# Patient Record
Sex: Female | Born: 1935 | Race: White | Hispanic: No
Health system: Midwestern US, Academic
[De-identification: ages and names within clinical notes are randomized; demographics above are authoritative.]

---

## 2017-01-05 LAB — COMPREHENSIVE METABOLIC PANEL
Lab: 0.6
Lab: 106
Lab: 113
Lab: 19
Lab: 25
Lab: 9.3
Lab: 95
Lab: 95

## 2017-04-10 ENCOUNTER — Encounter: Admit: 2017-04-10 | Discharge: 2017-04-10 | Payer: MEDICARE

## 2017-04-10 ENCOUNTER — Ambulatory Visit: Admit: 2017-04-10 | Discharge: 2017-04-11 | Payer: MEDICARE

## 2017-04-10 DIAGNOSIS — IMO0002 Squamous cell carcinoma: ICD-10-CM

## 2017-04-10 DIAGNOSIS — I10 Essential (primary) hypertension: ICD-10-CM

## 2017-04-10 DIAGNOSIS — K219 Gastro-esophageal reflux disease without esophagitis: ICD-10-CM

## 2017-04-10 DIAGNOSIS — M8580 Other specified disorders of bone density and structure, unspecified site: Principal | ICD-10-CM

## 2017-04-10 NOTE — Progress Notes
Date of Service: 04/10/2017    Tamara Weiss is a 83 y.o. female.       HPI     I had the opportunity to meet Tamara Weiss for the 1st time in my office today.  She is a long-time patient of Dr. Amelia Jo.  Her care has been transitioned to me with Dr. Amelia Jo retirement.  Tamara Weiss is 83 years of age.  She lives independently on [address] [place].  Her husband is a retired Stage manager.  He died after complications with Alzheimer's in 2009.       Tamara Weiss has had a very difficult summer.  She had a fall in June that led to a C2 fracture in her cervical spine.  This has been managed conservatively by a neurosurgeon through [facility].  She initially wore an Aspen collar for 6-8 weeks.  She is now in a soft collar which will she wear for an additional 6 weeks.  Surgery was not offered.  She is having pain.  She is uncomfortable with her brace.  Otherwise, she seems to be doing well following the significant injury.  We talked about the mechanism of her fall.  She was outside watering plants.  Her feet got 'mixed up'.  She fell forward, hitting her head on the concrete.  The head snapped back, resulting in the fracture.  She did not seek immediate attention.  Two days later, she was having increasing neck pain which took her to [facility].  She was thereafter transferred to [facility] where she remained in the hospital for 3 days.     Tamara Weiss additionally is concerned that she had a 'kissing bug bite'.  She has done quite a bit of research on this.  She actually sent the bug to [place] to have it evaluated.  She indicates they confirmed it was indeed a kissing bug.  This is a bug that transmits Chagas disease.  She is concerned that she potentially has this.     Tamara Weiss indicates increasing dyspnea with exertion.  This correlates with her injury, and I think is probably due to deconditioning.  I have asked her to get back to regular walking exercise at the gym.  She seems encouraged to do so.  Tamara Weiss is still driving on a regular basis.  She looks forward to increasing her activity as she recovers from this injury 3 months ago.    (FAO:[id number])             Vitals:    04/10/17 0934   BP: 149/86   Pulse: 67   Weight: 70.8 kg (156 lb)   Height: 1.626 m (5' 4)     Body mass index is 26.78 kg/m???.     Past Medical History  Patient Active Problem List    Diagnosis Date Noted   ??? Hypothyroidism 05/18/2010   ??? 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 None as of 2013    Osteoporosis Risk Factors and Contributing Diagnosis  ??? Menarche 14  Menopause:  Age 13 natural.  ERT:  12 years 1989  ??? Parental hip fracture history:  None.  Mother did have osteoporosis  ??? Glucocorticoid therapy history:  She took prednisone for polymyalgia rheumatica 2005 for approximately 1year  ??? Renal stone:  None  ??? Smoking history:  None  ??? Alcohol:  0/week     Osteoporosis Treatment History  ???  Miacalcin 2001-2003  ??? Evista 1999-2002  ??? Fosamax 2003-2009.  This was stopped due to length of therapy and stability of bone density.       ??? Vitamin D deficiency 05/18/2010   ??? PVC's (premature ventricular contractions) 02/27/2010     2600 isolated PVCs by 2006 holter     ??? Chest pain 02/27/2010     2007-Neg thallium  Resolved after DC Fosamax  2014-Thallium MPI:  EF 66%. Study is normal but with poor exercise capacity.  No definite perfusion defects are present.    ???05/09/16-Echo:  EF 55%.  LA is moderately enlarged.  AV is heavily sclerotic with mild AS.  Mitral annulus is moderately calcified.  Moderate TR.  Estimated PA systolic pressure is 45-50 mm Hg.  Mild MR is present     ??? Umbilical hernia 02/27/2010     2010: Laparoscopic umbilical hernia repair-bled from Hgb 14 to 8, took 3 mo to recover     ??? Skin cancer 02/27/2010     Sq cell removed from nose 2011     ??? Elevated blood pressure 02/27/2010 2011: some readings ~150s/80s, but most are WNL. (All outside Dr office are WNL)  05/09/16-Echo:  EF 55%.  LA is moderately enlarged.  AV is heavily sclerotic with mild AS.  Mitral annulus is moderately calcified.  Moderate TR.  Estimated PA systolic pressure is 45-50 mm Hg.  Mild MR is present           Review of Systems   Constitution: Positive for malaise/fatigue.   Cardiovascular: Positive for dyspnea on exertion.   Respiratory: Positive for shortness of breath.    All other systems reviewed and are negative.      Physical Exam  General Appearance: resting comfortably, no acute distress  Skin: warm, moist, no ulcers or xanthomas  Digits and Nails: no clubbing  Eyes: conjunctivae and lids normal, pupils are equal and round  Lips & Oral Mucosa: no pallor or cyanosis  Ear, Nose, Throat: No deformities, posterior oral pharynx clear  Neck Veins: neck veins are flat, neck veins are not distended  Chest Inspection: chest is normal in appearance  Respiratory Effort: breathing is unlabored, no respiratory distress  Auscultation/Percussion: lungs clear to auscultation, no rales, rhonchi, or wheezing  PMI: PMI not enlarged or displaced  Cardiac Rhythm: regular rhythm and normal rate  Cardiac Auscultation: Normal S1 & S2, no S3 or S4, no rub  Murmurs: no cardiac murmurs   Carotid Arteries: normal carotid upstroke bilaterally, no bruits  Pedal Pulses: normal symmetric pedal pulses  Lower Extremity Edema: no lower extremity edema  Abdominal Exam: soft, non-tender, no masses, bowel sounds normal  Abdominal Aorta: nonpalpable abdominal aorta; no abdominal bruits  Liver & Spleen: no organomegaly  Gait & Station: normal balance and gait  Muscle Strength: normal strength and tone  Neurologic Exam: neurological assessment grossly intact  Orientation: oriented to time, place and person  Affect & Mood: appropriate and sustained affect  Other: moves all extremities    Cardiovascular Studies  ECG - NSR with frequent PVCs Problems Addressed Today  Encounter Diagnoses   Name Primary?   ??? PVC's (premature ventricular contractions)    ??? Hypertension goal BP (blood pressure) < 130/80        Assessment and Plan     1. Ventricular ectopy.  Mrs. Padmanabhan has had frequent PVCs for several years.  She has been evaluated in the past by Dr. Clint Bolder and more recently by Dr. Glade Nurse.  Her testing over the years has included a stress test in 2014.  Her study at that time was normal, no ischemia, and preserved LV function.  More recently, she had an echocardiogram last summer.  It too was normal with preserved LV function.  She describes her ventricular ectopy as 'mildly bothersome'.  She has been living with this for years and does not seek further treatment.    2. Aortic valve stenosis.  Mrs. Mohamud had mild AS, based on her echocardiogram last summer.  I have asked her to have a repeat study, given her complaints of worsening dyspnea.  She will do this in the next few weeks.  3. Hypertension.  The patient's blood pressure is elevated today.  Historically, we have seen normal blood pressure readings consistently.  She indicates her home readings are normal.  I will hold off on any further medication adjustments at this time.   She is currently not on antihypertensive therapy.  I will see her back in the office in 6 months with a decision whether we need to start her on BP medications.  4. C2 fracture.  Mrs. Locust suffered a fall with resultant injury and a C2 fracture in June.  She is following with [facility].  She has been in an immobilizing brace and an Aspen collar for 6 weeks.  She is now in a soft collar which she will wear for an additional 6 weeks.       I look forward to seeing Mrs. Cookston back in the office in 6 months.    (ATF:[id number])             Current Medications (including today's revisions)  ??? levothyroxine (SYNTHROID) 75 mcg PO tablet Take 75 mcg by mouth daily.   ??? magnesium ??? Omega-3 Acid Ethyl Esters 1 gram cap Take 1 g by mouth twice daily with meals.

## 2017-04-11 DIAGNOSIS — R011 Cardiac murmur, unspecified: ICD-10-CM

## 2017-04-11 DIAGNOSIS — R030 Elevated blood-pressure reading, without diagnosis of hypertension: ICD-10-CM

## 2017-04-11 DIAGNOSIS — I10 Essential (primary) hypertension: ICD-10-CM

## 2017-04-11 DIAGNOSIS — R0609 Other forms of dyspnea: ICD-10-CM

## 2017-04-11 DIAGNOSIS — Z9181 History of falling: ICD-10-CM

## 2017-04-11 DIAGNOSIS — I493 Ventricular premature depolarization: Principal | ICD-10-CM

## 2017-04-30 ENCOUNTER — Ambulatory Visit: Admit: 2017-04-30 | Discharge: 2017-04-30 | Payer: MEDICARE

## 2017-04-30 DIAGNOSIS — I493 Ventricular premature depolarization: Principal | ICD-10-CM

## 2017-04-30 DIAGNOSIS — I10 Essential (primary) hypertension: ICD-10-CM

## 2017-04-30 DIAGNOSIS — R011 Cardiac murmur, unspecified: ICD-10-CM

## 2017-05-10 ENCOUNTER — Encounter: Admit: 2017-05-10 | Discharge: 2017-05-10 | Payer: MEDICARE

## 2017-05-10 DIAGNOSIS — I350 Nonrheumatic aortic (valve) stenosis: Principal | ICD-10-CM

## 2017-07-05 LAB — COMPREHENSIVE METABOLIC PANEL
Lab: 0.6
Lab: 0.8
Lab: 103
Lab: 108
Lab: 12
Lab: 141
Lab: 20
Lab: 21
Lab: 27
Lab: 4.5
Lab: 6.6
Lab: 64
Lab: 73
Lab: 9.8
Lab: 92

## 2017-07-05 LAB — THYROID STIMULATING HORMONE-TSH: Lab: 3.2

## 2017-11-07 ENCOUNTER — Encounter: Admit: 2017-11-07 | Discharge: 2017-11-07 | Payer: MEDICARE

## 2017-11-08 ENCOUNTER — Encounter: Admit: 2017-11-08 | Discharge: 2017-11-08 | Payer: MEDICARE

## 2017-11-08 ENCOUNTER — Ambulatory Visit: Admit: 2017-11-08 | Discharge: 2017-11-09 | Payer: MEDICARE

## 2017-11-08 DIAGNOSIS — M8580 Other specified disorders of bone density and structure, unspecified site: Principal | ICD-10-CM

## 2017-11-08 DIAGNOSIS — IMO0002 Squamous cell carcinoma: ICD-10-CM

## 2017-11-08 DIAGNOSIS — I10 Essential (primary) hypertension: ICD-10-CM

## 2017-11-08 DIAGNOSIS — I493 Ventricular premature depolarization: Secondary | ICD-10-CM

## 2017-11-08 DIAGNOSIS — K219 Gastro-esophageal reflux disease without esophagitis: ICD-10-CM

## 2017-11-09 DIAGNOSIS — I350 Nonrheumatic aortic (valve) stenosis: Principal | ICD-10-CM

## 2018-03-12 ENCOUNTER — Encounter: Admit: 2018-03-12 | Discharge: 2018-03-12 | Payer: MEDICARE

## 2018-03-13 ENCOUNTER — Encounter: Admit: 2018-03-13 | Discharge: 2018-03-13 | Payer: MEDICARE

## 2018-03-18 ENCOUNTER — Ambulatory Visit: Admit: 2018-03-18 | Discharge: 2018-03-19 | Payer: MEDICARE

## 2018-03-18 ENCOUNTER — Encounter: Admit: 2018-03-18 | Discharge: 2018-03-18 | Payer: MEDICARE

## 2018-03-18 DIAGNOSIS — I10 Essential (primary) hypertension: ICD-10-CM

## 2018-03-18 DIAGNOSIS — M8580 Other specified disorders of bone density and structure, unspecified site: Principal | ICD-10-CM

## 2018-03-18 DIAGNOSIS — R002 Palpitations: ICD-10-CM

## 2018-03-18 DIAGNOSIS — R072 Precordial pain: ICD-10-CM

## 2018-03-18 DIAGNOSIS — I493 Ventricular premature depolarization: ICD-10-CM

## 2018-03-18 DIAGNOSIS — IMO0002 Squamous cell carcinoma: ICD-10-CM

## 2018-03-18 DIAGNOSIS — R079 Chest pain, unspecified: Principal | ICD-10-CM

## 2018-03-18 DIAGNOSIS — K219 Gastro-esophageal reflux disease without esophagitis: ICD-10-CM

## 2018-03-18 DIAGNOSIS — I350 Nonrheumatic aortic (valve) stenosis: ICD-10-CM

## 2018-03-18 LAB — MAGNESIUM: Lab: 2.2

## 2018-03-18 LAB — POTASSIUM: Lab: 4.6

## 2018-03-18 LAB — THYROID STIMULATING HORMONE-TSH: Lab: 2.4

## 2018-03-20 ENCOUNTER — Encounter: Admit: 2018-03-20 | Discharge: 2018-03-20 | Payer: MEDICARE

## 2018-03-20 DIAGNOSIS — R002 Palpitations: ICD-10-CM

## 2018-03-20 DIAGNOSIS — I350 Nonrheumatic aortic (valve) stenosis: ICD-10-CM

## 2018-03-20 DIAGNOSIS — I493 Ventricular premature depolarization: ICD-10-CM

## 2018-03-20 DIAGNOSIS — R079 Chest pain, unspecified: Principal | ICD-10-CM

## 2018-03-20 DIAGNOSIS — R072 Precordial pain: ICD-10-CM

## 2018-03-21 ENCOUNTER — Encounter: Admit: 2018-03-21 | Discharge: 2018-03-21 | Payer: MEDICARE

## 2018-04-01 ENCOUNTER — Encounter: Admit: 2018-04-01 | Discharge: 2018-04-01 | Payer: MEDICARE

## 2018-04-01 ENCOUNTER — Ambulatory Visit: Admit: 2018-04-01 | Discharge: 2018-04-01 | Payer: MEDICARE

## 2018-04-01 ENCOUNTER — Ambulatory Visit: Admit: 2018-04-01 | Discharge: 2018-04-02 | Payer: MEDICARE

## 2018-04-01 DIAGNOSIS — R0789 Other chest pain: ICD-10-CM

## 2018-04-02 ENCOUNTER — Encounter: Admit: 2018-04-02 | Discharge: 2018-04-02 | Payer: MEDICARE

## 2018-04-22 ENCOUNTER — Encounter: Admit: 2018-04-22 | Discharge: 2018-04-22 | Payer: MEDICARE

## 2018-05-01 ENCOUNTER — Ambulatory Visit: Admit: 2018-05-01 | Discharge: 2018-05-02 | Payer: MEDICARE

## 2018-05-01 ENCOUNTER — Encounter: Admit: 2018-05-01 | Discharge: 2018-05-01 | Payer: MEDICARE

## 2018-05-01 DIAGNOSIS — IMO0002 Squamous cell carcinoma: ICD-10-CM

## 2018-05-01 DIAGNOSIS — I350 Nonrheumatic aortic (valve) stenosis: ICD-10-CM

## 2018-05-01 DIAGNOSIS — K219 Gastro-esophageal reflux disease without esophagitis: ICD-10-CM

## 2018-05-01 DIAGNOSIS — G4733 Obstructive sleep apnea (adult) (pediatric): ICD-10-CM

## 2018-05-01 DIAGNOSIS — M8580 Other specified disorders of bone density and structure, unspecified site: Principal | ICD-10-CM

## 2018-05-01 DIAGNOSIS — I272 Pulmonary hypertension, unspecified: ICD-10-CM

## 2018-05-01 DIAGNOSIS — I10 Essential (primary) hypertension: ICD-10-CM

## 2018-05-01 DIAGNOSIS — R0789 Other chest pain: Principal | ICD-10-CM

## 2019-01-09 ENCOUNTER — Encounter: Admit: 2019-01-09 | Discharge: 2019-01-09

## 2019-01-09 NOTE — Progress Notes
01/09/2019 4:48 PM Labs entered from PCP and routed to [facility] for review.

## 2019-01-12 ENCOUNTER — Encounter: Admit: 2019-01-12 | Discharge: 2019-01-12

## 2019-01-12 NOTE — Progress Notes
Pt has OV w/ MAA on 6/19, can discuss then

## 2019-01-12 NOTE — Progress Notes
Tamara Oyster, MD  P Cvm Nurse [facility]/[place]            Testing results reviewed   Some improvement in lipids   From last OV notes:   She declines medications other than fish oil supplements. She states her father had high cholesterol and lived to his 54s. She does not want to be on a statin

## 2019-01-16 ENCOUNTER — Encounter: Admit: 2019-01-16 | Discharge: 2019-01-16

## 2019-01-16 DIAGNOSIS — IMO0002 Squamous cell carcinoma: Secondary | ICD-10-CM

## 2019-01-16 DIAGNOSIS — I071 Rheumatic tricuspid insufficiency: Secondary | ICD-10-CM

## 2019-01-16 DIAGNOSIS — I10 Essential (primary) hypertension: Secondary | ICD-10-CM

## 2019-01-16 DIAGNOSIS — M8580 Other specified disorders of bone density and structure, unspecified site: Secondary | ICD-10-CM

## 2019-01-16 DIAGNOSIS — K219 Gastro-esophageal reflux disease without esophagitis: Secondary | ICD-10-CM

## 2019-01-16 DIAGNOSIS — I350 Nonrheumatic aortic (valve) stenosis: Secondary | ICD-10-CM

## 2019-01-16 NOTE — Progress Notes
Date of Service: 01/16/2019    Tamara Weiss is a 85 y.o. female.       HPI     85 year old female with a known history of aortic stenosis sleep apnea hypothyroidism hypertension and hyperlipidemia returns for follow-up visit.    She was last seen by me in October 2019.    She has elevated LDL but declined statins.    Recent testing includes a dobutamine stress MPI in September 2019 which was negative for ischemia.     Echocardiogram also in September showed an EF of 60% with mild aortic stenosis and moderate to severe tricuspid regurgitation.  Estimated peak systolic PA pressure was 51 mmHg.    She states she feels well with no chest pain or shortness of breath no PND orthopnea or swelling of ankles.  She remains active.           Vitals:    01/16/19 1302   BP: 112/72   BP Source: Arm, Left Upper   Pulse: 40   Weight: 69.8 kg (153 lb 12.8 oz)   Height: 1.626 m (5' 4)   PainSc: Zero     Body mass index is 26.4 kg/m???.     Past Medical History  Patient Active Problem List    Diagnosis Date Noted   ??? Moderate tricuspid regurgitation 01/16/2019   ??? Mixed hyperlipidemia 01/16/2019   ??? Pulmonary hypertension (HCC) 05/01/2018   ??? OSA (obstructive sleep apnea) 05/01/2018   ??? Precordial chest pain 03/18/2018   ??? Nonrheumatic aortic valve stenosis 11/07/2017     05/09/16-Echo:  EF  55%.  LA is moderately enlarged.  AV is heavily sclerotic with mild AS.  Mitral annulus is moderately calcified.  Moderate TR.  Estimated PA systolic pressure is 45-50 mm Hg.  Mild MR is present.  04/30/17-Echo:  EF 60%. 60%. Mild diastolic dysfunction.  Calcified restricted aortic valve, moderate stenosis.  Mean gradient just 14 mmHg, calculated AVA 1.2 cm2. Estimated PA systolic pressure is 34 mmHg.        ??? Hypothyroidism 05/18/2010   ??? 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 None as of 2013    Osteoporosis Risk Factors and Contributing Diagnosis ??? Menarche 14  Menopause:  Age 54 natural.  ERT:  12 years 1989  ??? Parental hip fracture history:  None.  Mother did have osteoporosis  ??? Glucocorticoid therapy history:  She took prednisone for polymyalgia rheumatica 2005 for approximately 1year  ??? Renal stone:  None  ??? Smoking history:  None  ??? Alcohol:  0/week     Osteoporosis Treatment History  ??? Miacalcin 2001-2003  ??? Evista 1999-2002  ??? Fosamax 2003-2009.  This was stopped due to length of therapy and stability of bone density.       ??? Vitamin D deficiency 05/18/2010   ??? PVC's (premature ventricular contractions) 02/27/2010     2600 isolated PVCs by 2006 holter     ??? Chest pain 02/27/2010     2007-Neg thallium  Resolved after DC Fosamax  2014-Thallium MPI:  EF 66%. Study is normal but with poor exercise capacity.  No definite perfusion defects are present.    ???05/09/16-Echo:  EF 55%.  LA is moderately enlarged.  AV is heavily sclerotic with mild AS.  Mitral annulus is moderately calcified.  Moderate TR.  Estimated PA systolic pressure is 45-50 mm Hg.  Mild MR is present     ??? Umbilical  hernia 02/27/2010     2010: Laparoscopic umbilical hernia repair-bled from Hgb 14 to 8, took 3 mo to recover     ??? Skin cancer 02/27/2010     Sq cell removed from nose 2011     ??? Elevated blood pressure 02/27/2010     2011: some readings ~150s/80s, but most are WNL. (All outside Dr office are WNL)  05/09/16-Echo:  EF 55%.  LA is moderately enlarged.  AV is heavily sclerotic with mild AS.  Mitral annulus is moderately calcified.  Moderate TR.  Estimated PA systolic pressure is 45-50 mm Hg.  Mild MR is present           Review of Systems   Constitution: Negative.   HENT: Negative.    Eyes: Negative.    Cardiovascular: Negative.    Respiratory: Negative.    Endocrine: Negative.    Hematologic/Lymphatic: Negative.    Skin: Negative.    Musculoskeletal: Positive for neck pain.   Gastrointestinal: Negative.    Genitourinary: Negative.    Neurological: Negative. Psychiatric/Behavioral: Negative.    Allergic/Immunologic: Negative.        Physical Exam  General Appearance: no acute distress  HEENT: EOMI, MM-moist, post OP-clear  Neck Veins: neck veins are flat & not distended  Carotid Arteries: no bruits  Chest Inspection: chest is normal in appearance  Auscultation/Percussion: lungs clear to auscultation, no rales, rhonchi, or wheezing  Cardiac Rhythm: regular rhythm & normal rate  Cardiac Auscultation: Normal S1 & S2, no S3 or S4, no rub  Murmurs: Ejection systolic murmur second right intercostal space  Extremities: no lower extremity edema; 2+ symmetric distal pulses  Skin: warm & intact  Neurologic Exam: oriented to time, place and person; no focal neurologic deficits      Cardiovascular Studies  As above    Assessment and Plan  Encounter Diagnoses   Name Primary?   ??? Nonrheumatic aortic valve stenosis Yes   ??? Moderate tricuspid regurgitation    ??? OSA (obstructive sleep apnea)    ??? Mixed hyperlipidemia             Ms Yow has mild aortic valve stenosis, moderate to severe TR and hypertension as well as hyperlipidemia. ???She is reluctant to have any medications.    She is asymptomatic from a cardiac standpoint.  We reviewed her prior echocardiogram.  I recommend we obtain a follow-up echo in 6 months when I will see her again.    We discussed her lipids and she again is adamant not to be on any lipid-lowering therapy other than her fish oil supplements.  She states she is sometimes forgetful in taking her supplements.  I encourage medication compliance.    I appreciate the opportunity of seeing Ms. Pawling in clinic today.  She will return for follow-up in 6 months with repeat echo at that time.           Current Medications (including today's revisions)  ??? levothyroxine (SYNTHROID) 75 mcg PO tablet Take 75 mcg by mouth daily.   ??? magnesium    ??? Omega-3 Acid Ethyl Esters 1 gram cap Take 1 g by mouth twice daily with meals.

## 2019-01-17 ENCOUNTER — Ambulatory Visit: Admit: 2019-01-16 | Discharge: 2019-01-17

## 2019-01-17 DIAGNOSIS — E7849 Other hyperlipidemia: Secondary | ICD-10-CM

## 2019-01-17 DIAGNOSIS — E782 Mixed hyperlipidemia: Secondary | ICD-10-CM

## 2019-01-17 DIAGNOSIS — G4733 Obstructive sleep apnea (adult) (pediatric): Secondary | ICD-10-CM

## 2019-02-06 ENCOUNTER — Ambulatory Visit: Admit: 2019-02-06 | Discharge: 2019-02-06

## 2019-02-06 ENCOUNTER — Encounter: Admit: 2019-02-06 | Discharge: 2019-02-06

## 2019-02-06 DIAGNOSIS — I071 Rheumatic tricuspid insufficiency: Secondary | ICD-10-CM

## 2019-02-06 DIAGNOSIS — I350 Nonrheumatic aortic (valve) stenosis: Secondary | ICD-10-CM

## 2019-02-13 ENCOUNTER — Encounter: Admit: 2019-02-13 | Discharge: 2019-02-13

## 2019-02-14 ENCOUNTER — Encounter: Admit: 2019-02-14 | Discharge: 2019-02-14

## 2019-02-16 ENCOUNTER — Encounter: Admit: 2019-02-16 | Discharge: 2019-02-16

## 2019-04-13 ENCOUNTER — Encounter: Admit: 2019-04-13 | Discharge: 2019-04-13 | Payer: MEDICARE

## 2019-04-13 ENCOUNTER — Ambulatory Visit: Admit: 2019-04-13 | Discharge: 2019-04-14 | Payer: MEDICARE

## 2019-04-14 ENCOUNTER — Encounter: Admit: 2019-04-14 | Discharge: 2019-04-14 | Payer: MEDICARE

## 2019-04-14 NOTE — Telephone Encounter
Faxed MRI order to [facility] per patient request.

## 2019-04-18 ENCOUNTER — Encounter: Admit: 2019-04-18 | Discharge: 2019-04-18 | Payer: MEDICARE

## 2019-04-20 ENCOUNTER — Encounter: Admit: 2019-04-20 | Discharge: 2019-04-20 | Payer: MEDICARE

## 2019-04-24 ENCOUNTER — Encounter: Admit: 2019-04-24 | Discharge: 2019-04-24 | Payer: MEDICARE

## 2019-04-24 NOTE — Telephone Encounter
Patient called confirming her upcoming appointment with Dr. [place] as well as wanting to change/update information on her mychart. Gave patient mychart IT line.

## 2019-05-25 ENCOUNTER — Encounter: Admit: 2019-05-25 | Discharge: 2019-05-25 | Payer: MEDICARE

## 2019-05-25 NOTE — Telephone Encounter
Pt called to say she is seeing someone else at [facility] for her neck as it is causing more problems. She just wanted to let us know. I told her to go to that appt and then we will go from there. She verbalized understanding.

## 2019-05-29 ENCOUNTER — Encounter: Admit: 2019-05-29 | Discharge: 2019-05-29 | Payer: MEDICARE

## 2019-05-29 NOTE — Telephone Encounter
Pt states her fractured neck is giving her problems and she's having numbness in her leg  She wants MAA to know that this is going on w/ her.   She had a CT w/ contrast at [place] 2 days ago.   She's been tx at [facility] for her broken neck and recently transitioned to [facility], she's wanting to get in w/ Cardiology, thinking she may need neck surgery, but hoping not.     Very long conversation, she has a lot going on and is concerned about a lot of things.   I let her know MAA is no longer going to [place] or [place], she was very disappointed about that.   She saw LDB years ago and he also cared for her (now deceased) husband so she would like to re establish care w/ him, reviewed the locations he goes to and she would like OP, scheduled her there and she will work on transportation for that appt. She drives locally, but not into [place] area.     She was very pleased to get appt w/ LDB.

## 2019-05-29 NOTE — Telephone Encounter
Patient calling re: upcoming appointment with Dr Bethanne Ginger recent imaging CT scan and MRI C spine at [facility] to request images).  Patient also requesting [place] visit.

## 2019-06-01 ENCOUNTER — Encounter: Admit: 2019-06-01 | Discharge: 2019-06-01 | Payer: MEDICARE

## 2019-06-02 ENCOUNTER — Encounter: Admit: 2019-06-02 | Discharge: 2019-06-02 | Payer: MEDICARE

## 2019-06-02 DIAGNOSIS — I350 Nonrheumatic aortic (valve) stenosis: Secondary | ICD-10-CM

## 2019-06-02 DIAGNOSIS — K219 Gastro-esophageal reflux disease without esophagitis: Secondary | ICD-10-CM

## 2019-06-02 DIAGNOSIS — I10 Essential (primary) hypertension: Secondary | ICD-10-CM

## 2019-06-02 DIAGNOSIS — M8580 Other specified disorders of bone density and structure, unspecified site: Secondary | ICD-10-CM

## 2019-06-02 DIAGNOSIS — IMO0002 Squamous cell carcinoma: Secondary | ICD-10-CM

## 2019-06-02 NOTE — Telephone Encounter
Pre Visit Planning- New Patient    Records received: No    Orders have been NA    Patient active in MyChart. No appointment reminder sent. .     Patient notified of upcoming appointment.    Updated chart: Medication, History and Allergies    CT scan and MRI requested via cloud and patient to bring day of appointment

## 2019-06-06 ENCOUNTER — Encounter: Admit: 2019-06-06 | Discharge: 2019-06-06 | Payer: MEDICARE

## 2019-06-07 ENCOUNTER — Encounter: Admit: 2019-06-07 | Discharge: 2019-06-07 | Payer: MEDICARE

## 2019-06-07 DIAGNOSIS — M542 Cervicalgia: Secondary | ICD-10-CM

## 2019-06-08 ENCOUNTER — Ambulatory Visit: Admit: 2019-06-08 | Discharge: 2019-06-09 | Payer: MEDICARE

## 2019-06-08 ENCOUNTER — Encounter: Admit: 2019-06-08 | Discharge: 2019-06-08 | Payer: MEDICARE

## 2019-06-08 DIAGNOSIS — Z9181 History of falling: Secondary | ICD-10-CM

## 2019-06-08 DIAGNOSIS — I071 Rheumatic tricuspid insufficiency: Secondary | ICD-10-CM

## 2019-06-08 DIAGNOSIS — S12100K Unspecified displaced fracture of second cervical vertebra, subsequent encounter for fracture with nonunion: Secondary | ICD-10-CM

## 2019-06-08 DIAGNOSIS — IMO0002 Squamous cell carcinoma: Secondary | ICD-10-CM

## 2019-06-08 DIAGNOSIS — M8580 Other specified disorders of bone density and structure, unspecified site: Secondary | ICD-10-CM

## 2019-06-08 DIAGNOSIS — K219 Gastro-esophageal reflux disease without esophagitis: Secondary | ICD-10-CM

## 2019-06-08 DIAGNOSIS — E782 Mixed hyperlipidemia: Secondary | ICD-10-CM

## 2019-06-08 DIAGNOSIS — I10 Essential (primary) hypertension: Secondary | ICD-10-CM

## 2019-06-08 DIAGNOSIS — I350 Nonrheumatic aortic (valve) stenosis: Secondary | ICD-10-CM

## 2019-06-08 MED ORDER — ROSUVASTATIN 5 MG PO TAB
5 mg | ORAL_TABLET | Freq: Every day | ORAL | 3 refills | 90.00000 days | Status: AC
Start: 2019-06-08 — End: ?

## 2019-06-08 NOTE — Telephone Encounter
Sent to Dr. Artist Beach.

## 2019-06-08 NOTE — Telephone Encounter
-----   Message from Louis Meckel, LPN sent at 15/10/84  3:40 PM CST -----  Regarding: LDB- progress notes  VM from patient on triage line.   Said that she had a great visit with LDB today.   Said that she just read the progress notes and there are 2 little things wrong in it and she needs to talk to some one about it before it is sealed in the record.   Call her at either # in chart.     Patient called back and sid the 2 things are as follows:  1- He said Dr. Hassell Done and it should be Dr. Nida Boatman at [facility].  2- He said that she drives to [place] and it should be [place].

## 2019-06-08 NOTE — Telephone Encounter
Tamara Reedy, MD  Arminda Resides, BSN              I will fix those-1 was the transcription and 1 was me

## 2019-06-09 ENCOUNTER — Encounter: Admit: 2019-06-09 | Discharge: 2019-06-09 | Payer: MEDICARE

## 2019-06-09 ENCOUNTER — Ambulatory Visit: Admit: 2019-06-09 | Discharge: 2019-06-09 | Payer: MEDICARE

## 2019-06-09 DIAGNOSIS — IMO0002 Squamous cell carcinoma: Secondary | ICD-10-CM

## 2019-06-09 DIAGNOSIS — M8580 Other specified disorders of bone density and structure, unspecified site: Secondary | ICD-10-CM

## 2019-06-09 DIAGNOSIS — I10 Essential (primary) hypertension: Secondary | ICD-10-CM

## 2019-06-09 DIAGNOSIS — I350 Nonrheumatic aortic (valve) stenosis: Secondary | ICD-10-CM

## 2019-06-09 DIAGNOSIS — S12100G Unspecified displaced fracture of second cervical vertebra, subsequent encounter for fracture with delayed healing: Secondary | ICD-10-CM

## 2019-06-09 DIAGNOSIS — K219 Gastro-esophageal reflux disease without esophagitis: Secondary | ICD-10-CM

## 2019-06-09 NOTE — Progress Notes
Date of Service: 06/09/2019        Chief complaint    Chief Complaint   Patient presents with   ? Left Leg - Numbness   ? New Patient     Cervical fracture   ? Other     patient states no pain - having numbness left leg       HPI  Tamara Weiss is a 85 y.o. female with a history of multiple falls who presents with a C2 fracture.  Neck pain she does not have any weakness or numbness in her arms or legs.  She lives by herself.  She fell once and 2018 and another time in 2019.  Both time she was found to have a type II odontoid fracture which was treated with cervical immobilization.  Currently she is doing very well.    PMH    Medical History:   Diagnosis Date   ? Aortic stenosis    ? Cataract 8/11 and 9/11    bilateral   ? Esophagitis     with Fosamax use   ? GERD (gastroesophageal reflux disease)    ? Hematoma - postoperative 2010    post umbilical hernia repair   ? Hypertension    ? Osteopenia    ? 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 Vitamin D deficiency            ROS  Review of Systems   Constitutional: Negative.    HENT: Negative.    Eyes: Negative.    Respiratory: Negative.    Cardiovascular: Negative.    Gastrointestinal: Negative.    Endocrine: Negative.    Genitourinary: Negative.    Musculoskeletal: Negative.    Skin: Negative.    Allergic/Immunologic: Negative.    Neurological: Positive for numbness.   Hematological: Negative.           FH    Family History   Problem Relation Age of Onset   ? None Reported Mother    ? None Reported Father      Social History     Socioeconomic History   ? Marital status: Widowed     Spouse name: Not on file   ? Number of children: Not on file   ? Years of education: Not on file   ? Highest education level: Not on file   Occupational History   ? Not on file   Tobacco Use   ? Smoking status: Never Smoker   ? Smokeless tobacco: Never Used   Substance and Sexual Activity   ? Alcohol use: Not Currently   ? Drug use: Never   ? Sexual activity: Not Currently Other Topics Concern   ? Not on file   Social History Narrative   ? Not on file           [facility]    Surgical History:   Procedure Laterality Date   ? UMBILICAL HERNIA REPAIR  2010    with post op hematoma   ? CATARACT REMOVAL WITH IMPLANT  8/11    bilateral   ? DOPPLER ECHOCARDIOGRAPHY     ? HOLTER MONITOR     ? MYOCARDIAL PERFUSION IMG STUDY         Meds    ? carboxymethylcellulose sodium (THERATEARS OP) Place 1 drop into or around eye(s) daily as needed.   ? levothyroxine (SYNTHROID) 75 mcg PO tablet Take 75 mcg by mouth daily.   ? multivit with iron,minerals (  MULTIVITAMIN AND MINERALS PO) Take 1 tablet by mouth daily.   ? omeg3/epa/dha/fish oil/flax/E (THERATEARS NUTRITION PO) Take 3 capsules by mouth daily.   ? rosuvastatin (CRESTOR) 5 mg tablet Take one tablet by mouth daily.       Allergies    Allergies   Allergen Reactions   ? Penicillins RASH   ? Famvir [Famciclovir] EDEMA     Eyes   ? Norvasc [Amlodipine] UNKNOWN   ? Sodium Lauryl Sulfate SEE COMMENTS     Found in toothpaste/causes mouth ulcers         Exam  Physical Exam   Exam deferred due to telehealth visit.    Vitals:   Vitals:    06/09/19 0907   Weight: 69.4 kg (153 lb)   Height: 162.6 cm (64)   PainSc: One     Body mass index is 26.26 kg/m?.      Imaging:  No results found for this or any previous visit.  Minimally displaced type II dens fracture.      Assessment  85 year old female with history of type II dens fracture and multiple falls.  Imaging findings reviewed.  Fracture appears stable on repeat imaging.  Given patient's age and comorbidities she is not a surgical candidate.  Continue cervical collar when there is risk of falling.  Return to clinic as needed.    Plan                                No orders of the defined types were placed in this encounter.

## 2019-06-10 ENCOUNTER — Encounter: Admit: 2019-06-10 | Discharge: 2019-06-10 | Payer: MEDICARE

## 2019-06-10 NOTE — Progress Notes
Cardiac Navigation Intake Assessment Document    Patient Name:  Tamara Weiss  MRN:  [id number]  DOB:  [birth year]  Insurance:   Payor: MEDICARE / Plan: MEDICARE PART A AND B / Product Type: Medicare /     Appointment Info: Telehealth visit  Future Appointments   Date Time Provider Department Center   07/09/2019 11:00 AM Marcell Barlow, MD [facility] CVM Exam       Diagnosis & Reason for Visit:  AS/TAVR eval, TR eval    Physician Info:   ? Referring Physician(Cardiologist):  Leanne Lovely MD  ? PCP:  Susa Day MD    Location of Films:  IN HOUSE    History of Present Illness:      02/06/19 Echo Normal left ventricular contractility.  Estimated LVEF 60%.  Mild proximal basal septal hypertrophy.  Normal right ventricular contractility.  Normal central venous pressure.  Moderate mitral annular calcification.  Trace mitral regurgitation.  Normal tricuspid valve motion.  Moderate to severe tricuspid regurgitation.  Estimated PA pressure 47.  Transvalvular aortic valve gradients suggest mild aortic stenosis and are slightly increased from the prior study but aortic valve velocity ratio is 0.28 and calculated aortic valve area is 0.88 cm?, possibly due to low LVOT peak velocity and VTI.  The pulmonic valve, pulmonary artery, and transverse aorta are not well-visualized.  Trace pulmonic regurgitation   06/08/19         Medical history(Pertinent to valve workup) :  Multiple falls with C2 fracture (currently with cervical immobilization), Skin cancer, Sleep apnea-CPAP    Surgery/Procedure history (Pertinent to valve workup) : Echo (results above)    Reported heart failure symptoms: dyspnea with exertion    Dental health:   Goes to the dentist routinely: Yes   Last dental check up: Oct. 2020    NEEDS Assessment:                             ? Social Work/Financial:  No need identified                      ? Physical:  Independent                                  ? Communication:  No needs identified Reviewed records with Norman Clay NP                    Plan: Office visit with cardiology only    Comments:  06/10/2019 1:48 PM Spoke to Ms. Tamara Weiss Updated on current plan. Determined the best date for an appointment. Educated to all appointment information.

## 2019-06-11 ENCOUNTER — Encounter: Admit: 2019-06-11 | Discharge: 2019-06-11 | Payer: MEDICARE

## 2019-07-09 ENCOUNTER — Ambulatory Visit: Admit: 2019-07-09 | Discharge: 2019-07-10 | Payer: MEDICARE

## 2019-07-09 ENCOUNTER — Encounter: Admit: 2019-07-09 | Discharge: 2019-07-09 | Payer: MEDICARE

## 2019-07-09 DIAGNOSIS — I350 Nonrheumatic aortic (valve) stenosis: Secondary | ICD-10-CM

## 2019-07-09 DIAGNOSIS — IMO0002 Squamous cell carcinoma: Secondary | ICD-10-CM

## 2019-07-09 DIAGNOSIS — G4733 Obstructive sleep apnea (adult) (pediatric): Secondary | ICD-10-CM

## 2019-07-09 DIAGNOSIS — K219 Gastro-esophageal reflux disease without esophagitis: Secondary | ICD-10-CM

## 2019-07-09 DIAGNOSIS — M8580 Other specified disorders of bone density and structure, unspecified site: Secondary | ICD-10-CM

## 2019-07-09 DIAGNOSIS — S12100K Unspecified displaced fracture of second cervical vertebra, subsequent encounter for fracture with nonunion: Secondary | ICD-10-CM

## 2019-07-09 DIAGNOSIS — I10 Essential (primary) hypertension: Secondary | ICD-10-CM

## 2019-07-10 DIAGNOSIS — E782 Mixed hyperlipidemia: Secondary | ICD-10-CM

## 2019-07-10 DIAGNOSIS — I350 Nonrheumatic aortic (valve) stenosis: Principal | ICD-10-CM

## 2019-07-10 DIAGNOSIS — I071 Rheumatic tricuspid insufficiency: Secondary | ICD-10-CM

## 2019-07-10 DIAGNOSIS — Z9181 History of falling: Secondary | ICD-10-CM

## 2019-07-15 ENCOUNTER — Encounter: Admit: 2019-07-15 | Discharge: 2019-07-15 | Payer: MEDICARE

## 2019-07-15 DIAGNOSIS — M8580 Other specified disorders of bone density and structure, unspecified site: Secondary | ICD-10-CM

## 2019-07-15 DIAGNOSIS — I350 Nonrheumatic aortic (valve) stenosis: Secondary | ICD-10-CM

## 2019-07-15 DIAGNOSIS — K219 Gastro-esophageal reflux disease without esophagitis: Secondary | ICD-10-CM

## 2019-07-15 DIAGNOSIS — IMO0002 Squamous cell carcinoma: Secondary | ICD-10-CM

## 2019-07-15 DIAGNOSIS — I10 Essential (primary) hypertension: Secondary | ICD-10-CM

## 2019-07-16 ENCOUNTER — Encounter: Admit: 2019-07-16 | Discharge: 2019-07-16 | Payer: MEDICARE

## 2019-07-23 ENCOUNTER — Encounter: Admit: 2019-07-23 | Discharge: 2019-07-23 | Payer: MEDICARE

## 2019-11-30 ENCOUNTER — Encounter: Admit: 2019-11-30 | Discharge: 2019-11-30 | Payer: MEDICARE

## 2019-11-30 DIAGNOSIS — I350 Nonrheumatic aortic (valve) stenosis: Secondary | ICD-10-CM

## 2019-11-30 NOTE — Telephone Encounter
Pt called seeking clarification on restrictions. She was released from neurosurgery in 12/20. She was advised no specific restrictions at this time, however, to be cautious about avoiding falls and injuries with over activity. Encouraged her to restart physical therapy as she discontinued when her therapist left town and pandemic started.

## 2019-12-09 ENCOUNTER — Encounter: Admit: 2019-12-09 | Discharge: 2019-12-09 | Payer: MEDICARE

## 2019-12-09 ENCOUNTER — Ambulatory Visit: Admit: 2019-12-09 | Discharge: 2019-12-09 | Payer: MEDICARE

## 2019-12-09 DIAGNOSIS — I350 Nonrheumatic aortic (valve) stenosis: Secondary | ICD-10-CM

## 2019-12-11 ENCOUNTER — Encounter: Admit: 2019-12-11 | Discharge: 2019-12-11 | Payer: MEDICARE

## 2020-01-06 ENCOUNTER — Encounter: Admit: 2020-01-06 | Discharge: 2020-01-06 | Payer: MEDICARE

## 2020-01-07 ENCOUNTER — Ambulatory Visit: Admit: 2020-01-07 | Discharge: 2020-01-08 | Payer: MEDICARE

## 2020-01-07 ENCOUNTER — Encounter: Admit: 2020-01-07 | Discharge: 2020-01-07 | Payer: MEDICARE

## 2020-01-07 DIAGNOSIS — I350 Nonrheumatic aortic (valve) stenosis: Secondary | ICD-10-CM

## 2020-01-07 DIAGNOSIS — IMO0002 Squamous cell carcinoma: Secondary | ICD-10-CM

## 2020-01-07 DIAGNOSIS — K219 Gastro-esophageal reflux disease without esophagitis: Secondary | ICD-10-CM

## 2020-01-07 DIAGNOSIS — I10 Essential (primary) hypertension: Secondary | ICD-10-CM

## 2020-01-07 DIAGNOSIS — M8580 Other specified disorders of bone density and structure, unspecified site: Secondary | ICD-10-CM

## 2020-01-07 DIAGNOSIS — I071 Rheumatic tricuspid insufficiency: Secondary | ICD-10-CM

## 2020-01-13 ENCOUNTER — Encounter: Admit: 2020-01-13 | Discharge: 2020-01-13 | Payer: MEDICARE

## 2020-01-13 DIAGNOSIS — I10 Essential (primary) hypertension: Secondary | ICD-10-CM

## 2020-01-13 DIAGNOSIS — M8580 Other specified disorders of bone density and structure, unspecified site: Secondary | ICD-10-CM

## 2020-01-13 DIAGNOSIS — I350 Nonrheumatic aortic (valve) stenosis: Secondary | ICD-10-CM

## 2020-01-13 DIAGNOSIS — IMO0002 Squamous cell carcinoma: Secondary | ICD-10-CM

## 2020-01-13 DIAGNOSIS — K219 Gastro-esophageal reflux disease without esophagitis: Secondary | ICD-10-CM

## 2020-04-01 ENCOUNTER — Encounter: Admit: 2020-04-01 | Discharge: 2020-04-01 | Payer: MEDICARE

## 2020-04-12 ENCOUNTER — Encounter: Admit: 2020-04-12 | Discharge: 2020-04-12 | Payer: MEDICARE

## 2020-12-30 ENCOUNTER — Encounter: Admit: 2020-12-30 | Discharge: 2020-12-30 | Payer: MEDICARE

## 2020-12-30 DIAGNOSIS — I350 Nonrheumatic aortic (valve) stenosis: Secondary | ICD-10-CM

## 2021-01-12 ENCOUNTER — Encounter: Admit: 2021-01-12 | Discharge: 2021-01-12 | Payer: MEDICARE

## 2021-01-12 ENCOUNTER — Ambulatory Visit: Admit: 2021-01-12 | Discharge: 2021-01-13 | Payer: MEDICARE

## 2021-01-12 DIAGNOSIS — I350 Nonrheumatic aortic (valve) stenosis: Secondary | ICD-10-CM

## 2021-01-12 DIAGNOSIS — E782 Mixed hyperlipidemia: Secondary | ICD-10-CM

## 2021-01-12 DIAGNOSIS — IMO0002 Squamous cell carcinoma: Secondary | ICD-10-CM

## 2021-01-12 DIAGNOSIS — I071 Rheumatic tricuspid insufficiency: Secondary | ICD-10-CM

## 2021-01-12 DIAGNOSIS — K219 Gastro-esophageal reflux disease without esophagitis: Secondary | ICD-10-CM

## 2021-01-12 DIAGNOSIS — M8580 Other specified disorders of bone density and structure, unspecified site: Secondary | ICD-10-CM

## 2021-01-12 DIAGNOSIS — I10 Essential (primary) hypertension: Secondary | ICD-10-CM

## 2021-01-17 ENCOUNTER — Encounter: Admit: 2021-01-17 | Discharge: 2021-01-17 | Payer: MEDICARE

## 2021-01-17 DIAGNOSIS — I10 Essential (primary) hypertension: Secondary | ICD-10-CM

## 2021-01-17 DIAGNOSIS — I350 Nonrheumatic aortic (valve) stenosis: Secondary | ICD-10-CM

## 2021-01-17 DIAGNOSIS — M8580 Other specified disorders of bone density and structure, unspecified site: Secondary | ICD-10-CM

## 2021-01-17 DIAGNOSIS — K219 Gastro-esophageal reflux disease without esophagitis: Secondary | ICD-10-CM

## 2021-01-17 DIAGNOSIS — IMO0002 Squamous cell carcinoma: Secondary | ICD-10-CM

## 2021-03-28 ENCOUNTER — Encounter: Admit: 2021-03-28 | Discharge: 2021-03-28 | Payer: MEDICARE

## 2021-06-20 ENCOUNTER — Encounter: Admit: 2021-06-20 | Discharge: 2021-06-20 | Payer: MEDICARE

## 2021-07-04 ENCOUNTER — Encounter: Admit: 2021-07-04 | Discharge: 2021-07-04 | Payer: MEDICARE

## 2021-07-04 ENCOUNTER — Ambulatory Visit: Admit: 2021-07-04 | Discharge: 2021-07-04 | Payer: MEDICARE

## 2021-07-04 DIAGNOSIS — I350 Nonrheumatic aortic (valve) stenosis: Secondary | ICD-10-CM

## 2021-07-06 ENCOUNTER — Encounter: Admit: 2021-07-06 | Discharge: 2021-07-06 | Payer: MEDICARE

## 2021-07-06 ENCOUNTER — Ambulatory Visit: Admit: 2021-07-06 | Discharge: 2021-07-07 | Payer: MEDICARE

## 2021-07-06 DIAGNOSIS — I350 Nonrheumatic aortic (valve) stenosis: Secondary | ICD-10-CM

## 2021-07-06 DIAGNOSIS — I10 Essential (primary) hypertension: Secondary | ICD-10-CM

## 2021-07-06 DIAGNOSIS — E782 Mixed hyperlipidemia: Secondary | ICD-10-CM

## 2021-07-06 DIAGNOSIS — IMO0002 Squamous cell carcinoma: Secondary | ICD-10-CM

## 2021-07-06 DIAGNOSIS — M8580 Other specified disorders of bone density and structure, unspecified site: Secondary | ICD-10-CM

## 2021-07-06 DIAGNOSIS — K219 Gastro-esophageal reflux disease without esophagitis: Secondary | ICD-10-CM

## 2021-07-12 ENCOUNTER — Encounter: Admit: 2021-07-12 | Discharge: 2021-07-12 | Payer: MEDICARE

## 2021-07-12 DIAGNOSIS — I10 Essential (primary) hypertension: Secondary | ICD-10-CM

## 2021-07-12 DIAGNOSIS — IMO0002 Squamous cell carcinoma: Secondary | ICD-10-CM

## 2021-07-12 DIAGNOSIS — K219 Gastro-esophageal reflux disease without esophagitis: Secondary | ICD-10-CM

## 2021-07-12 DIAGNOSIS — I350 Nonrheumatic aortic (valve) stenosis: Secondary | ICD-10-CM

## 2021-07-12 DIAGNOSIS — M8580 Other specified disorders of bone density and structure, unspecified site: Secondary | ICD-10-CM

## 2021-10-18 ENCOUNTER — Encounter: Admit: 2021-10-18 | Discharge: 2021-10-18 | Payer: MEDICARE

## 2021-12-19 ENCOUNTER — Encounter: Admit: 2021-12-19 | Discharge: 2021-12-19 | Payer: MEDICARE

## 2022-01-09 ENCOUNTER — Encounter: Admit: 2022-01-09 | Discharge: 2022-01-09 | Payer: MEDICARE

## 2022-01-09 NOTE — Telephone Encounter
Pt called nurse line x 2 today to request echo order to be faxed to [facility] so she can schedule it before her OV with Dr. Geronimo Boot there later this month. Echo order faxed to [facility] scheduling at [phone]. Contact number: [phone].

## 2022-01-15 ENCOUNTER — Ambulatory Visit: Admit: 2022-01-15 | Discharge: 2022-01-15 | Payer: MEDICARE

## 2022-01-15 ENCOUNTER — Encounter: Admit: 2022-01-15 | Discharge: 2022-01-15 | Payer: MEDICARE

## 2022-01-15 DIAGNOSIS — I350 Nonrheumatic aortic (valve) stenosis: Secondary | ICD-10-CM

## 2022-01-25 ENCOUNTER — Encounter: Admit: 2022-01-25 | Discharge: 2022-01-25 | Payer: MEDICARE

## 2022-01-25 DIAGNOSIS — IMO0002 Squamous cell carcinoma: Secondary | ICD-10-CM

## 2022-01-25 DIAGNOSIS — M8580 Other specified disorders of bone density and structure, unspecified site: Secondary | ICD-10-CM

## 2022-01-25 DIAGNOSIS — R0789 Other chest pain: Secondary | ICD-10-CM

## 2022-01-25 DIAGNOSIS — I10 Essential (primary) hypertension: Secondary | ICD-10-CM

## 2022-01-25 DIAGNOSIS — Z136 Encounter for screening for cardiovascular disorders: Secondary | ICD-10-CM

## 2022-01-25 DIAGNOSIS — I493 Ventricular premature depolarization: Secondary | ICD-10-CM

## 2022-01-25 DIAGNOSIS — I350 Nonrheumatic aortic (valve) stenosis: Secondary | ICD-10-CM

## 2022-01-25 DIAGNOSIS — K219 Gastro-esophageal reflux disease without esophagitis: Secondary | ICD-10-CM

## 2022-01-25 DIAGNOSIS — I272 Pulmonary hypertension, unspecified: Secondary | ICD-10-CM

## 2022-01-25 NOTE — Patient Instructions
Thank you for visiting our office today.    We would like to make the following medication adjustments:  NONE      Otherwise continue the same medications as you have been doing.          We will be pursuing the following tests after your appointment today:       Orders Placed This Encounter    ECG 12-LEAD    2D + DOPPLER ECHO         We will plan to see you back in 5 months with echo.  Please call us in the meantime with any questions or concerns.        Please allow 5-7 business days for our providers to review your results. All normal results will go to MyChart. If you do not have Mychart, it is strongly recommended to get this so you can easily view all your results. If you do not have mychart, we will attempt to call you once with normal lab and testing results. If we cannot reach you by phone with normal results, we will send you a letter.  If you have not heard the results of your testing after one week please give [place] a call.       Your Cardiovascular [facility] Team Brett Canales, Pilar Jarvis, Shawna Orleans, and [place])  phone number is [phone].

## 2022-04-03 IMAGING — US ECHOCOMPL
1 series · 12 of 24 positions shown · non-contrast
Comparison: none

[Series 1: us echo 2d, complete · 105 acquisitions, 12 frames shown]
[im 5/105]
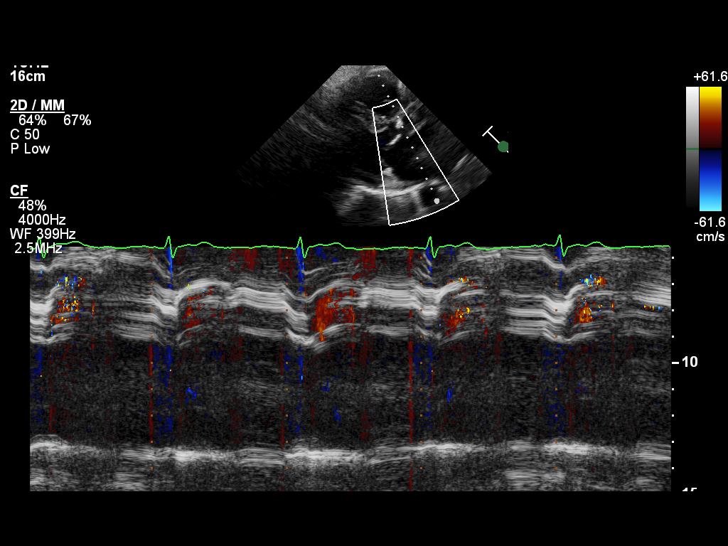
[im 14/105]
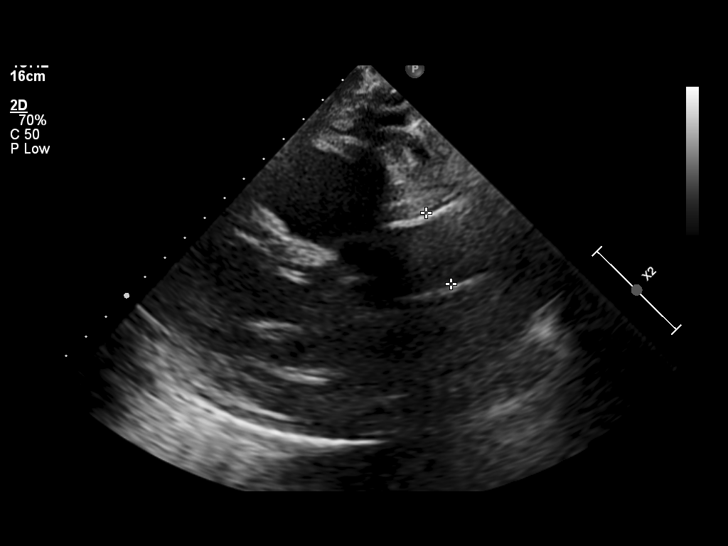
[im 23/105]
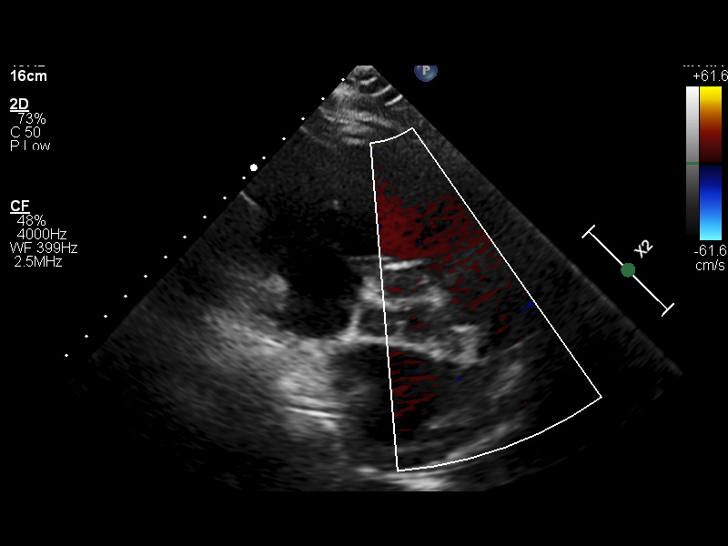
[im 28/105]
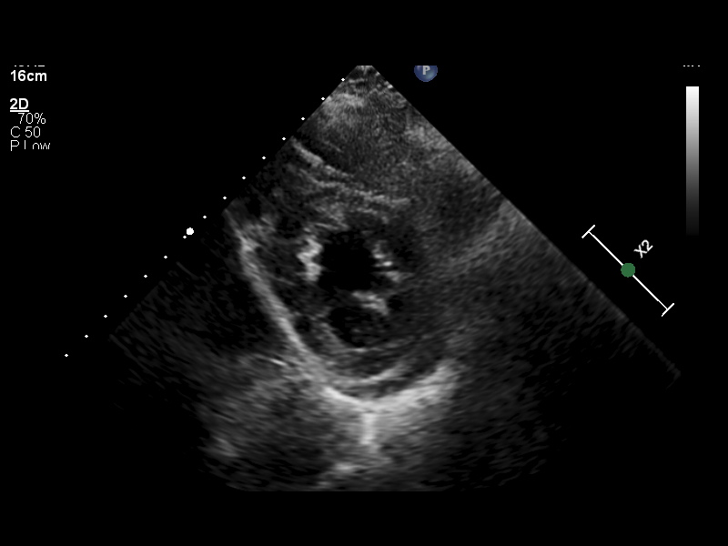
[im 37/105]
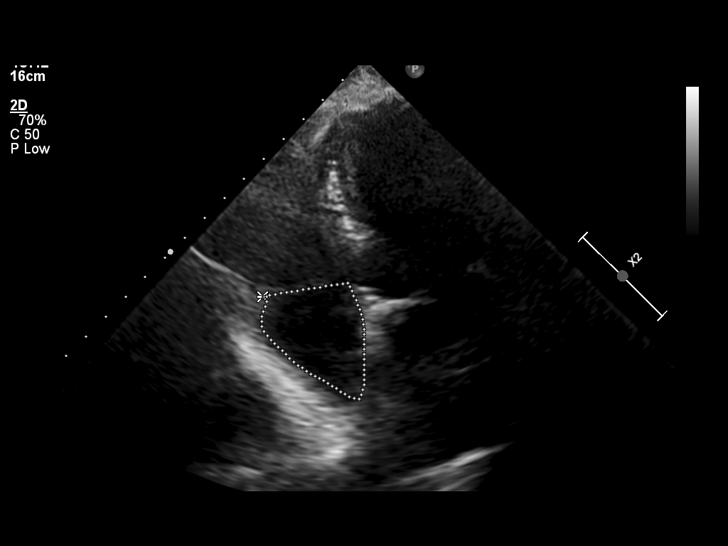
[im 46/105]
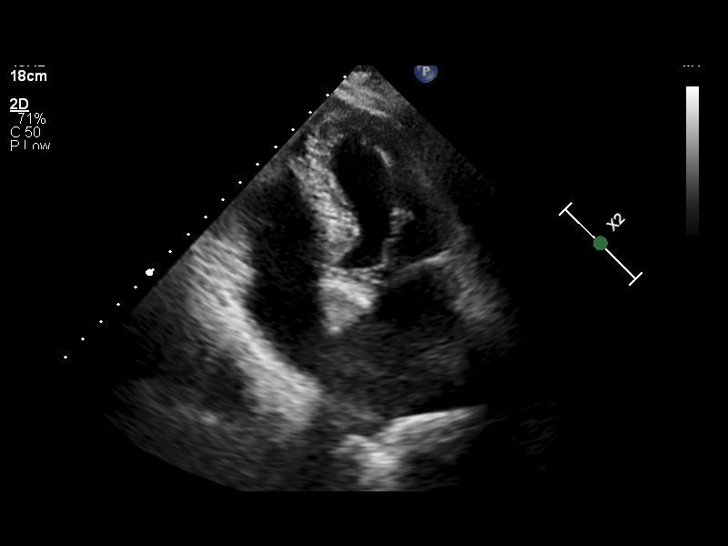
[im 59/105]
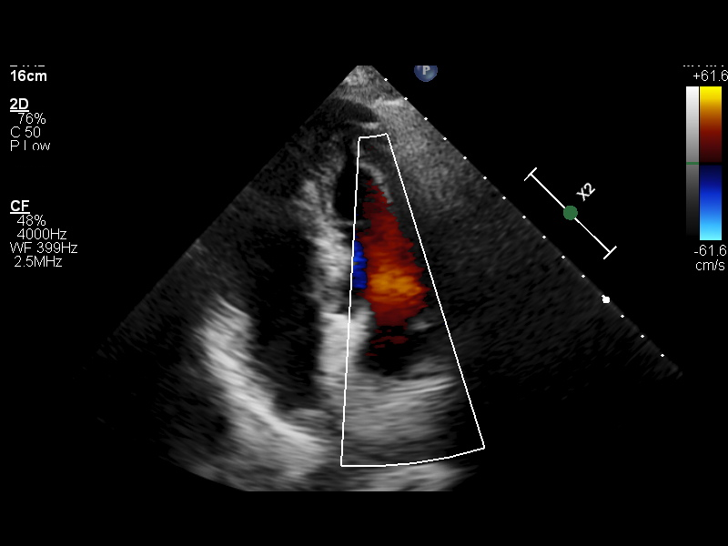
[im 64/105]
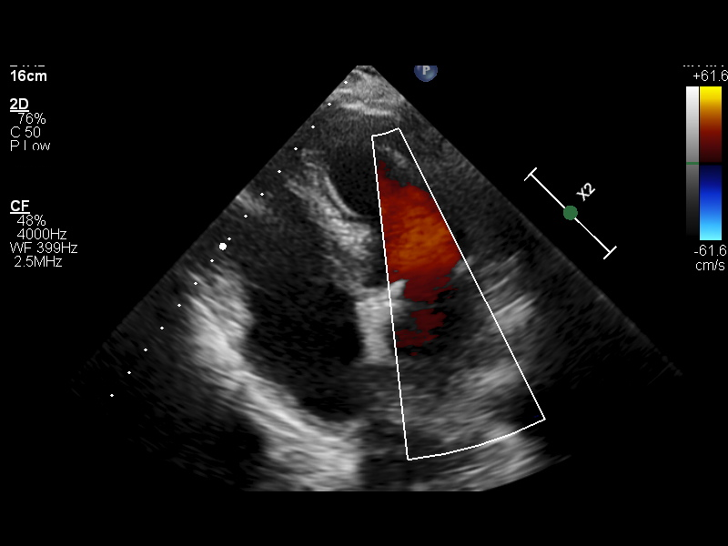
[im 73/105]
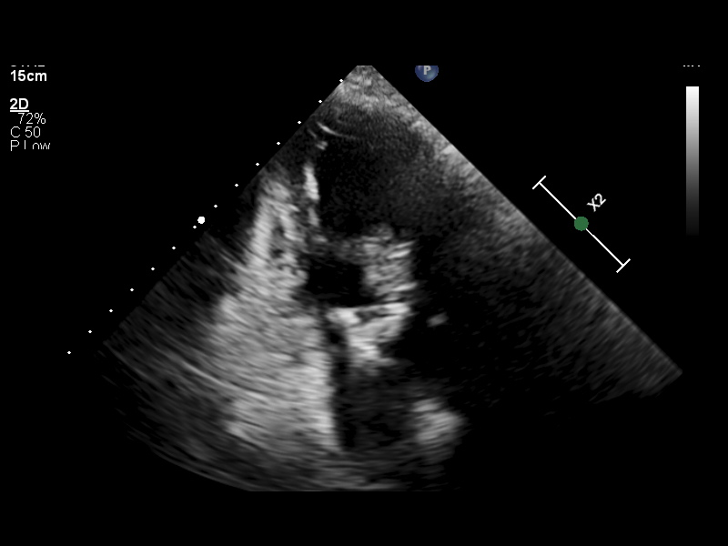
[im 82/105]
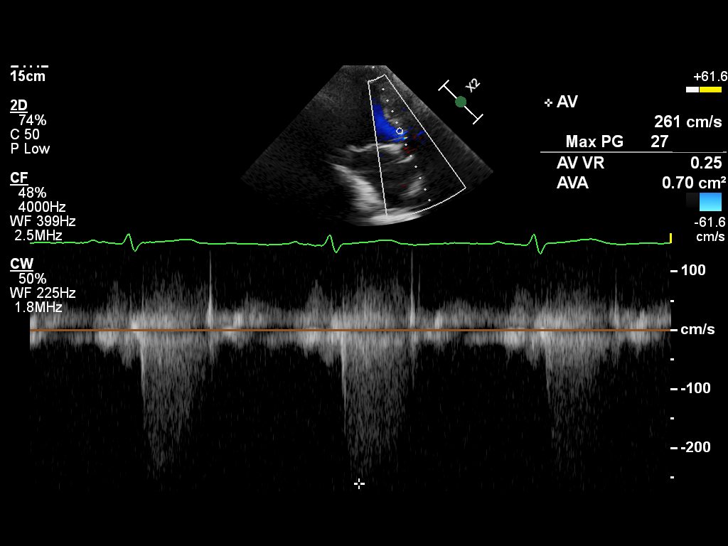
[im 95/105]
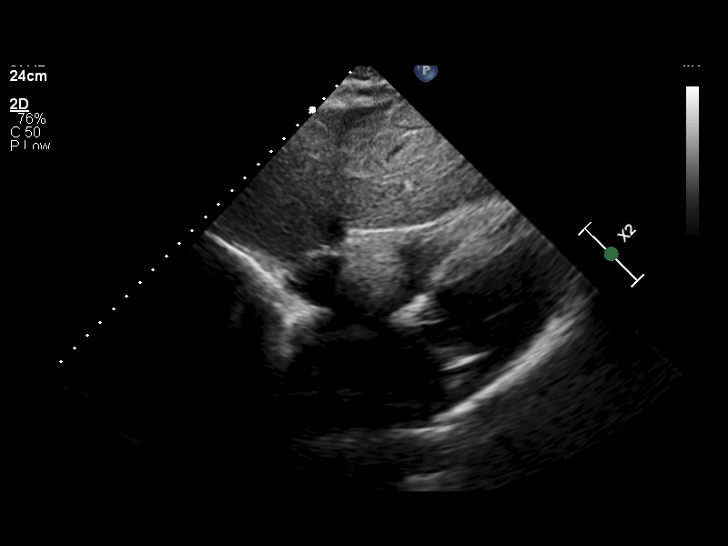
[im 105/105]
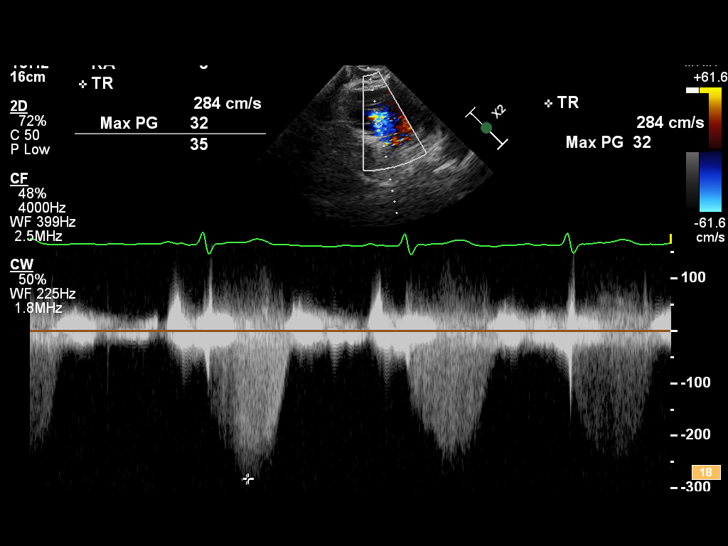

[12 of 24 positions shown; findings below may reference images not displayed]

Primary:     ANDEAS, FRANS N                                                        Date: [DATE]/

07/04/21 -  [DATE]D + DOPPLER ECHO
Location Performed: [HOSPITAL]

Referring Provider: Rachael Attree
[place]:
Location of Interp:
Sonographer: External Staff

Indications:           nonrheumatic AV stenosis

Vitals
Height   Weight   BSA (Calculated)   BP   Comments
162.6 cm (5' 4")   65 kg (143 lb 4.8 oz)   1.71   158/53

Interpretation Summary

The left ventricular systolic function is normal. The visually estimated ejection fraction is 60%.
Grade II (moderate) left ventricular diastolic dysfunction.
The right ventricular size, wall thickness and systolic function are normal.
Left Atrium: Severely dilated.
Severely calcified aortic valve, spectral Doppler calculations revealed an MG = 19 mmHg compatible
with mild stenosis. However, by 2D echo exam only the aortic valve systolic opening is severely
limited, this finding is compatible with severe aortic valve stenosis, this is very likely NF- LG
(normal flow-low gradient) severe aortic stenosis.
No other significant cardiac valves abnormalities.
Estimated Peak Systolic PA Pressure 34 mmHg

Comparison is made with a prior  study dated 12/30/2020: Ejection fraction was 65%.  Ellipeddi study
aortic valve MG = 34 mmHg

Echocardiographic Findings
Left Ventricle   The left ventricular size is normal. Mild concentric hypertrophy. The left
ventricular systolic function is normal. The visually estimated ejection fraction is 60%. There are
no segmental wall motion abnormalities. Normal septal motion. Grade II (moderate) left ventricular
diastolic dysfunction.
Right Ventricle   The right ventricular size, wall thickness and systolic function are normal.
Left Atrium   Severely dilated.
Right Atrium   Normal size.
IVC/SVC   Normal central venous pressure (0-5 mm Hg).
Mitral Valve   Normal valve structure. No regurgitation. There is severe mitral annular
calcification without stenosis.
Tricuspid Valve   The tricuspid valve was not well seen. No stenosis. Mild regurgitation.
Aortic Valve   Severely calcified aortic valve, spectral Doppler calculations revealed an MG = 19
mmHg compatible with mild stenosis.  However, by 2D echo exam only the aortic valve systolic opening
is severely limited, this finding is compatible with severe aortic valve stenosis, this is very
likely NF- LG (normal flow-low gradient) severe aortic stenosis.
Pulmonary   The pulmonic valve was not seen well but no Doppler evidence of stenosis. Trace
regurgitation.
The pulmonary artery was not well seen.
Aorta   Normal aorta. The aortic root and ascending aorta are normal in size.
Pericardium   No pericardial effusion.

Left Ventricular Wall Scoring
Resting   Score Index: 1.000   Percent Normal: 100.0%

The left ventricular wall motion is normal.

Left Heart 2D Measurements (Normal Ranges)
EF (Visual)
60 %
LVIDD
4.1 cm  (Range: 3.8 - 5.2)
LVIDS
2.6 cm  (Range: 2.2 - 3.5)
IVS
1.2 cm  (Range: 0.6 - 0.9)
LV PW
1.3 cm  (Range: 0.6 - 0.9)
LA Size
4.0 cm  (Range: 2.7 - 3.8)

Right Heart 2D   M-Mode Measurements (Normal Ranges) (Range)
RV Basal Dia
3.8 cm  (2.5 - 4.1)
RV Mid Dia
3.1 cm  (1.9 - 3.5)
CONSIGLIO
10.5 cm2  (<18)
M-Mode TAPSE
1.5 cm  (>1.7)

Left Heart 2D Addnl Measurements (Normal Ranges)
LV Systolic Vol
44 mL  (Range: 14 - 42)
LV Systolic Vol Index
26 mL/m2  (Range: 8 - 24)
LV Diastolic Vol
17 mL  (Range: 46 - 106)
LV Diastolic Vol Index
10 mL/m2  (Range: 29 - 61)
LA Vol
50 mL  (Range: 22 - 52)
LA Vol Index
29.24 mL/m2  (Range: 16 - 34)
LV Mass
182 g  (Range: 67 - 162)
LV Mass Index
107 g/m2  (Range: 43 - 95)
RWT
0.63  (Range: <=0.42)

Aortic Root Measurements (Normal Ranges)
Sinus
3.1 cm  (Range: 2.4 - 3.6)
ARTIS BLANKENSHIP
2.7 cm

Doppler (Spectral and Color Flow)
Estimated Peak Systolic PA Pressure
Aortic valve area
0.79 cm2
Aortic valve mean gradient
Aortic valve peak gradient
Aortic valve peak velocity
2.9 m/s
Aortic valve velocity ratio

Tech Notes:

## 2022-04-30 ENCOUNTER — Encounter: Admit: 2022-04-30 | Discharge: 2022-04-30 | Payer: MEDICARE

## 2022-06-04 ENCOUNTER — Ambulatory Visit: Admit: 2022-06-04 | Discharge: 2022-06-04 | Payer: MEDICARE

## 2022-06-04 ENCOUNTER — Encounter: Admit: 2022-06-04 | Discharge: 2022-06-04 | Payer: MEDICARE

## 2022-06-04 DIAGNOSIS — I272 Pulmonary hypertension, unspecified: Secondary | ICD-10-CM

## 2022-06-14 ENCOUNTER — Encounter: Admit: 2022-06-14 | Discharge: 2022-06-14 | Payer: MEDICARE

## 2022-06-14 DIAGNOSIS — IMO0002 Squamous cell carcinoma: Secondary | ICD-10-CM

## 2022-06-14 DIAGNOSIS — M8580 Other specified disorders of bone density and structure, unspecified site: Secondary | ICD-10-CM

## 2022-06-14 DIAGNOSIS — K219 Gastro-esophageal reflux disease without esophagitis: Secondary | ICD-10-CM

## 2022-06-14 DIAGNOSIS — I350 Nonrheumatic aortic (valve) stenosis: Secondary | ICD-10-CM

## 2022-06-14 DIAGNOSIS — I10 Essential (primary) hypertension: Secondary | ICD-10-CM

## 2022-10-04 ENCOUNTER — Encounter: Admit: 2022-10-04 | Discharge: 2022-10-04 | Payer: MEDICARE

## 2022-10-07 IMAGING — US ECHOCOMPL
1 series · 13 of 24 positions shown · non-contrast
Comparison: none

[Series 1: us echo 2d, complete · 77 acquisitions, 13 frames shown]
[im 1/77]
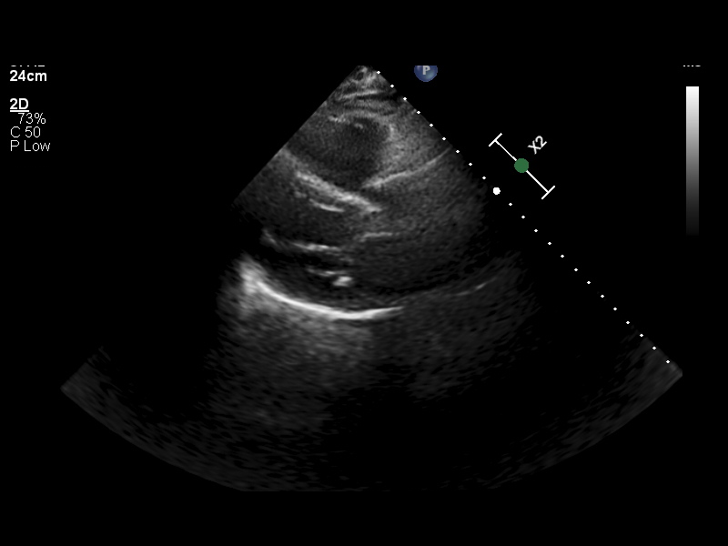
[im 7/77]
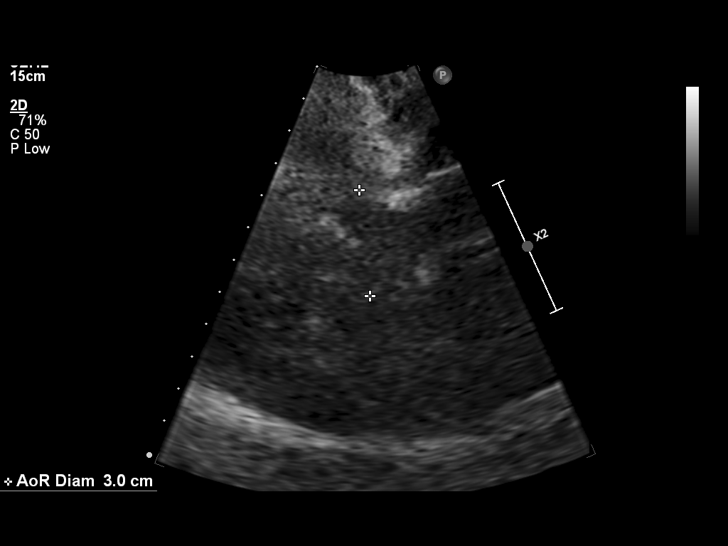
[im 14/77]
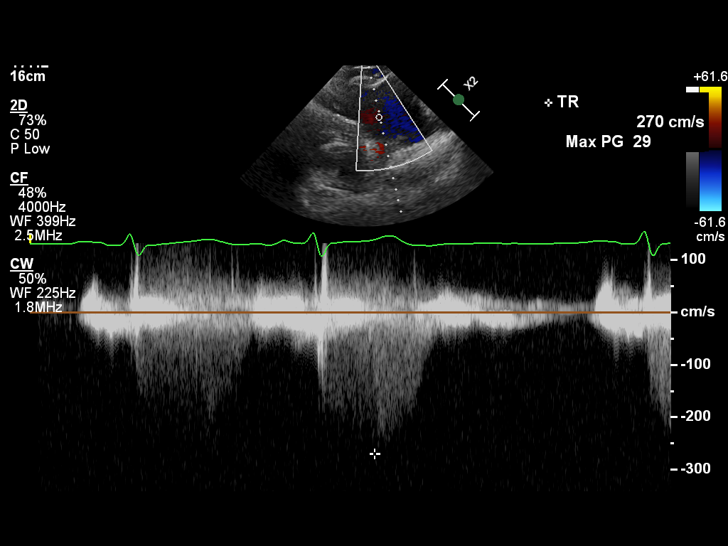
[im 20/77]
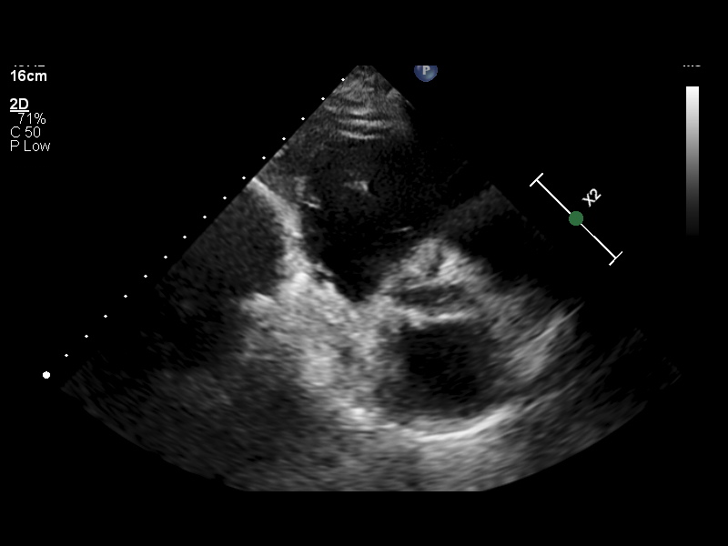
[im 27/77]
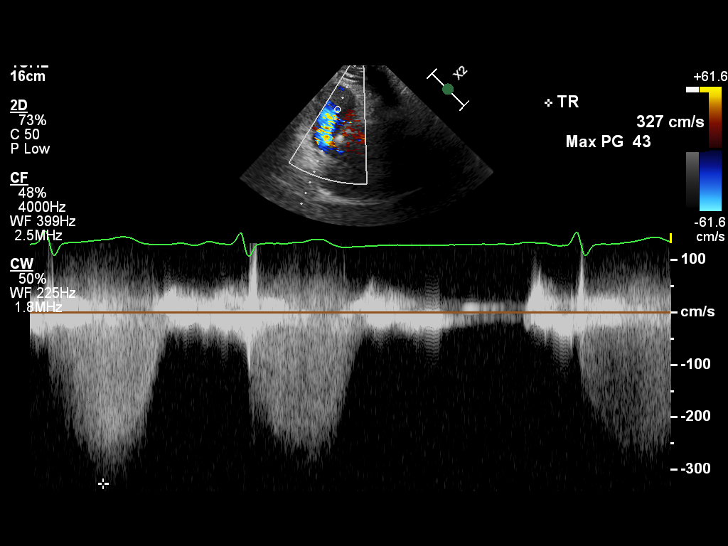
[im 34/77]
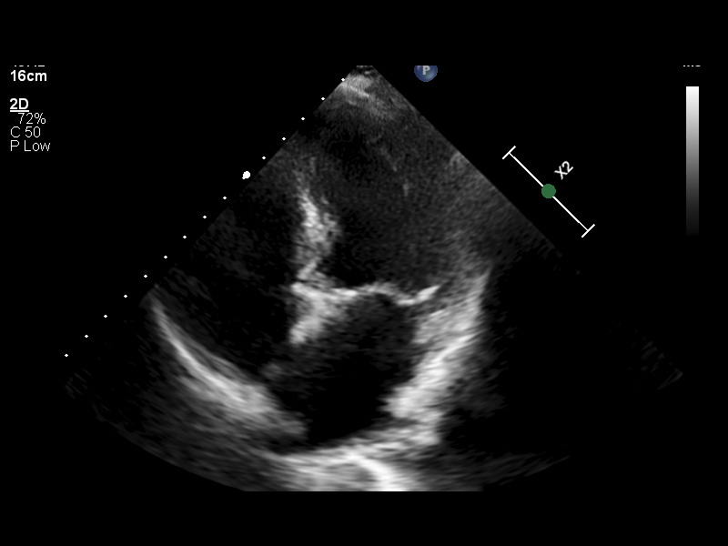
[im 37/77]
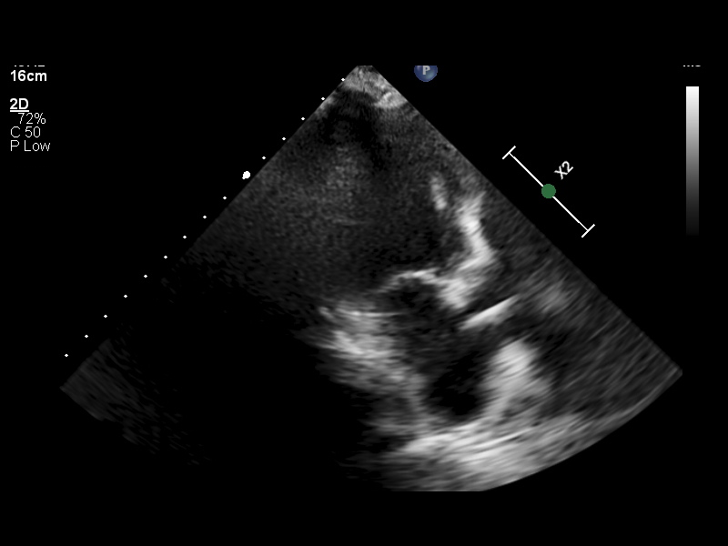
[im 40/77]
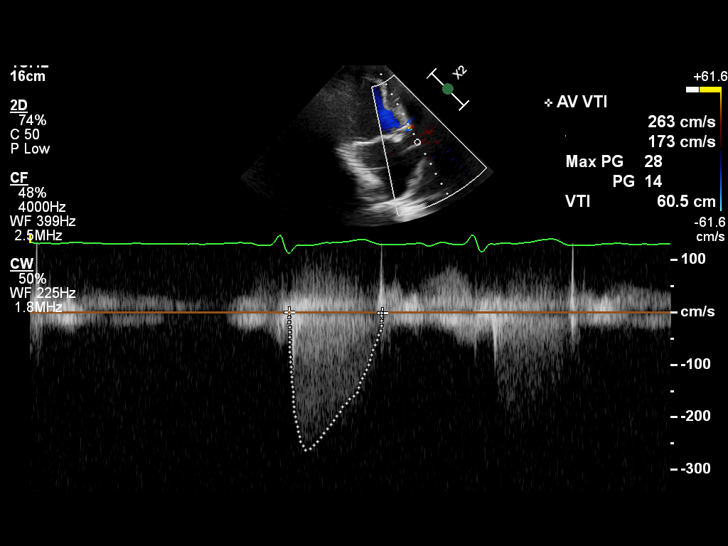
[im 50/77]
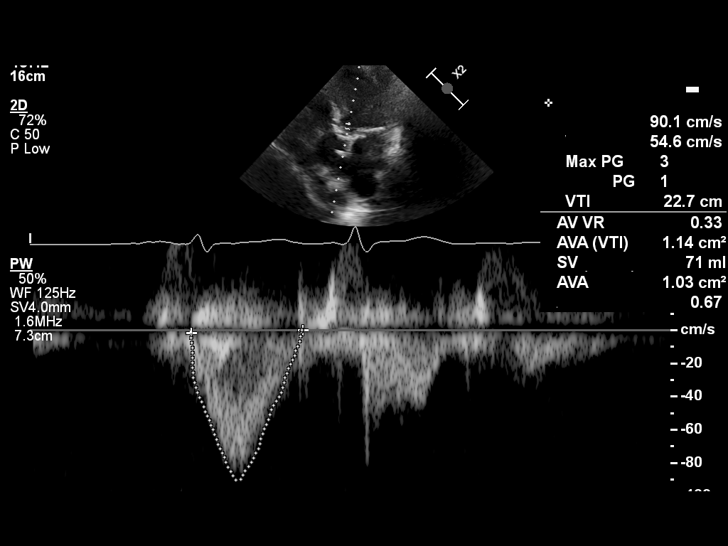
[im 60/77]
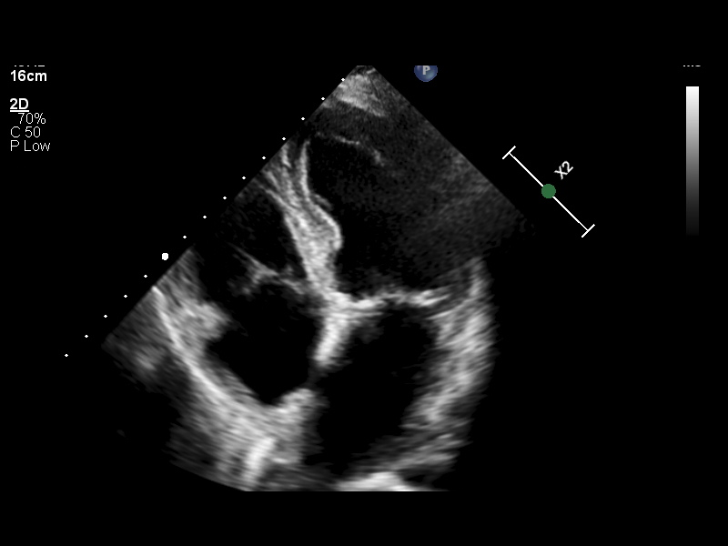
[im 63/77]
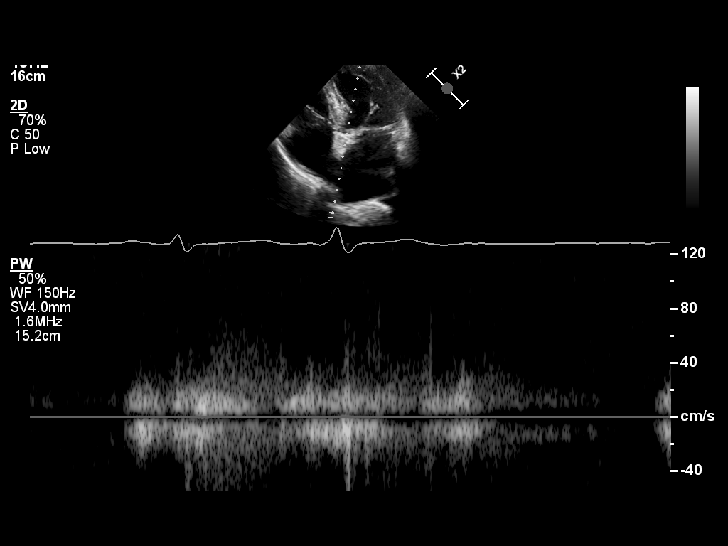
[im 70/77]
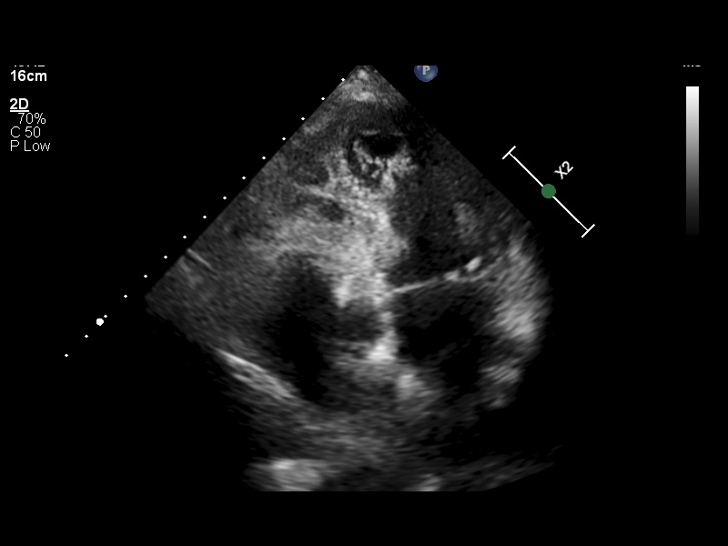
[im 77/77]
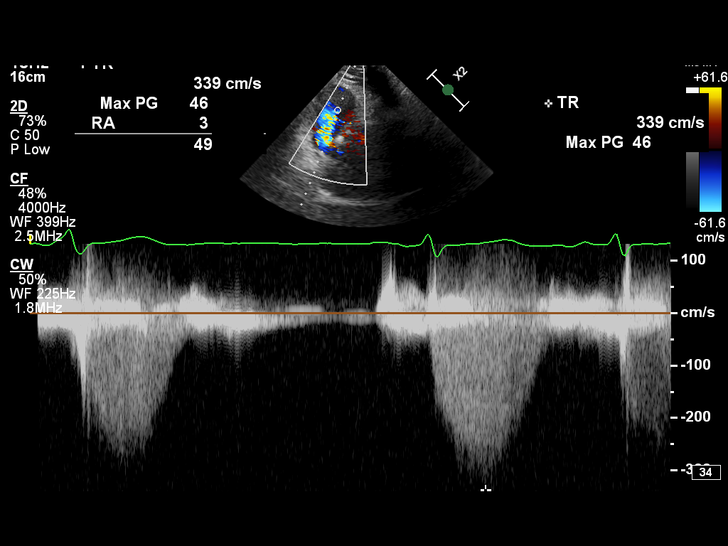

[13 of 24 positions shown; findings below may reference images not displayed]

Primary:     BATRES, JANKARLOS                                                        Date: [DATE]/

12/30/20 -  [DATE]D + DOPPLER ECHO
Location Performed: [HOSPITAL]

Referring Provider:
Fellow:
Location of Interp: [facility]
Sonographer: External Staff

Indications: Aortic stenosis

Vitals
Height   Weight   BSA (Calculated)   BP   Comments
162.6 cm (5' 4")   64.9 kg (143 lb)   1.71   158/53

Interpretation Summary
LVEF=65% With Abnormal Septal Motion
Mild Concentric LVH
Moderate Left Atrial Dilatation
Mild Right Atrial Dilatation
Moderate To Severe Aortic Valve Stenosis: Peak Velocity=2.9m/sec, Mean Gradient=H4mmZg, Velocity
Ratio=0.28, Area=5.15cm0
Compared To Previous Study Dated 12/09/19 The Mean Aortic Valve Gradient Has Increases From 2Amm4g,
And The Aortic Valve Area Has Decreased From 8.00cm0, And The Valve Velocity Ratio Has Decreased
Mitral Annular Calcification With Mild Regurgitation
Normal TAPSE=2.0cm
No Pericardial Effusion

Echocardiographic Findings
Left Ventricle   Mild concentric. The left ventricular systolic function is normal. The visually
estimated ejection fraction is 65%. Cannot determine left ventricular diastolic function. Elevated
left atrial pressure.
Right Ventricle   The right ventricular size, wall thickness and systolic function are normal.
Left Atrium   Moderately dilated.
Right Atrium   Mildly dilated.
IVC/SVC   Normal central venous pressure (0-5 mm Hg).
Mitral Valve   No stenosis. Mild regurgitation. There is mild mitral annular calcification.
Tricuspid Valve   Moderate regurgitation.
Aortic Valve   Moderate to severe stenosis. No regurgitation.
Pulmonary   The pulmonic valve was not well seen.
Aorta   Normal aorta.
Pericardium   Pericardium is normal. No pericardial effusion.

Left Ventricular Wall Scoring
Score Index: 1.000   Percent Normal: 100.0%

The left ventricular wall motion is normal.

Left Heart 2D Measurements (Normal Ranges)
EF (Visual)
65 %
LVIDD
3.9 cm (Range: 3.8 - 5.2)
LVIDS
2.3 cm (Range: 2.2 - 3.5)
IVS
1.1 cm (Range: 0.6 - 0.9)
LV PW
1.2 cm (Range: 0.6 - 0.9)
LA Size
3.7 cm (Range: 2.7 - 3.8)

Right Heart 2D   M-Mode Measurements (Normal Ranges) (Range)
RV Basal Dia
3.5 cm (2.5 - 4.1)
RV Mid Dia
2.4 cm (1.9 - 3.5)
JARQUIN
19.2 cm2 (<18)
M-Mode TAPSE
2.0 cm (>1.7)

Left Heart 2D Addnl Measurements (Normal Ranges)
LA Vol
82 mL (Range: 22 - 52)
LA Vol Index
47.95 (Range: 16 - 34)
LV Mass
150 g (Range: 67 - 162)
LV Mass Index
87 g/m2 (Range: 43 - 95)
RWT
0.62 (Range: <=0.42)

Aortic Root Measurements (Normal Ranges)
Sinus
3 cm (Range: 2.4 - 3.6)
KEMANYE IYOLA
2.5 cm

Doppler (Spectral and Color Flow)
Aortic valve area
1.01 cm2
Aortic valve mean gradient
Aortic valve peak gradient
Aortic valve peak velocity
2.9 m/s
Aortic valve velocity ratio

Tech Notes:

tm

## 2022-10-15 IMAGING — US ECHOCOMPL
1 series · 12 of 24 positions shown · non-contrast
Comparison: none

[Series 1: us echo 2d, complete · 87 acquisitions, 12 frames shown]
[im 1/87]
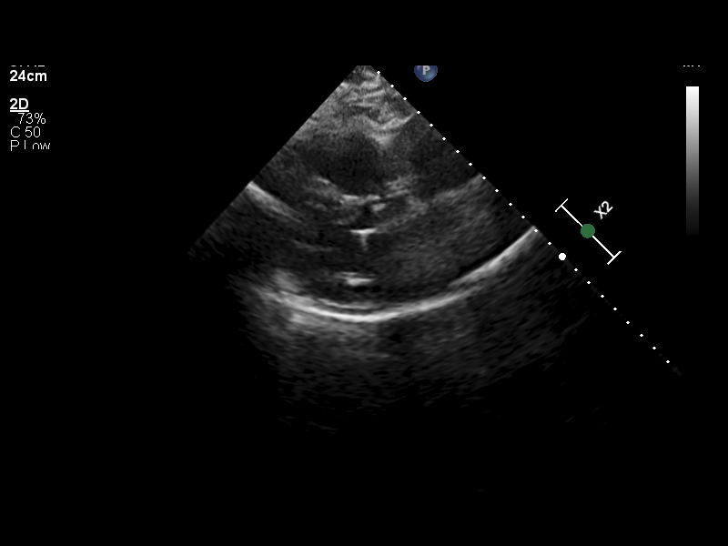
[im 8/87]
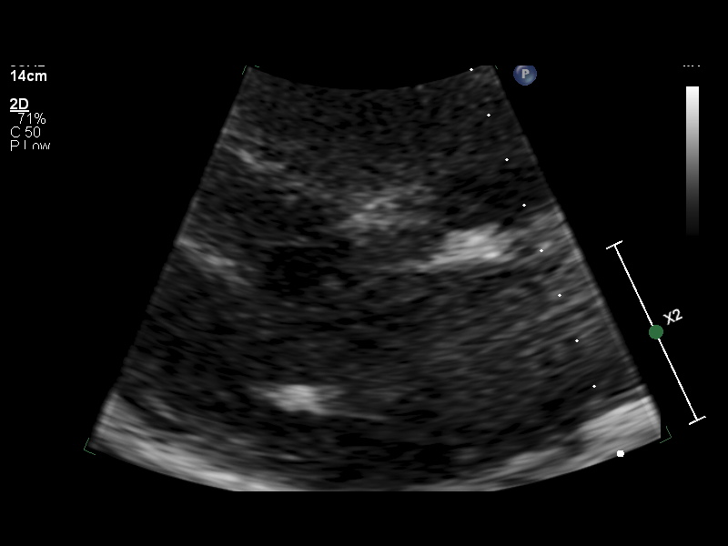
[im 15/87]
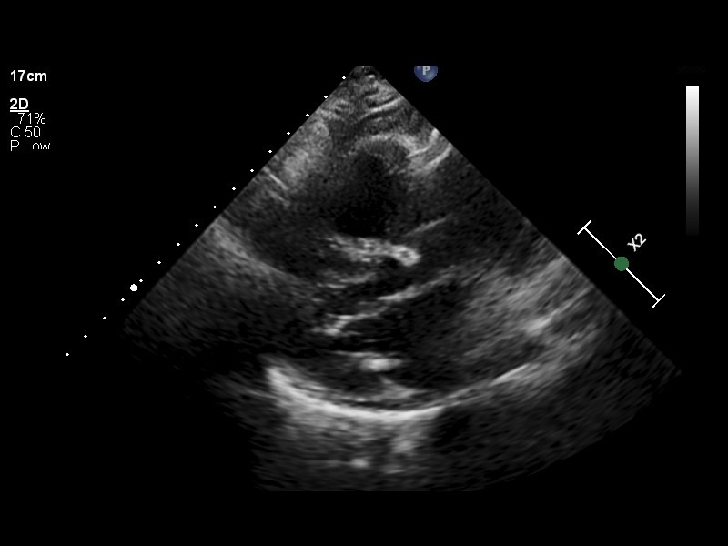
[im 23/87]
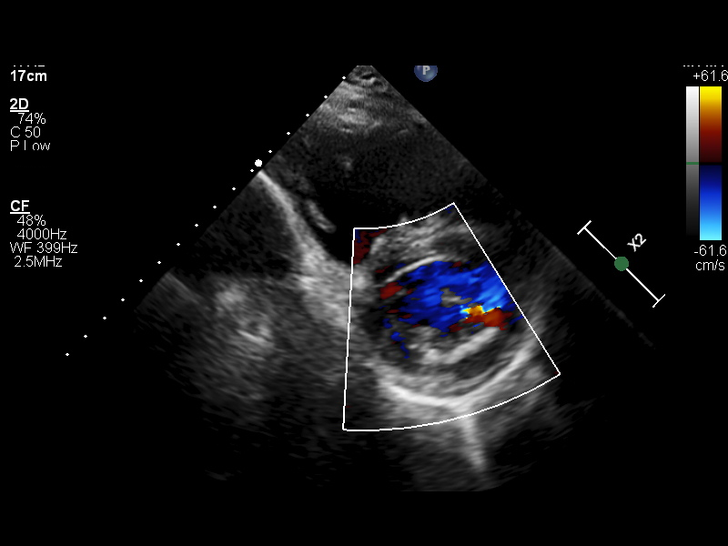
[im 27/87]
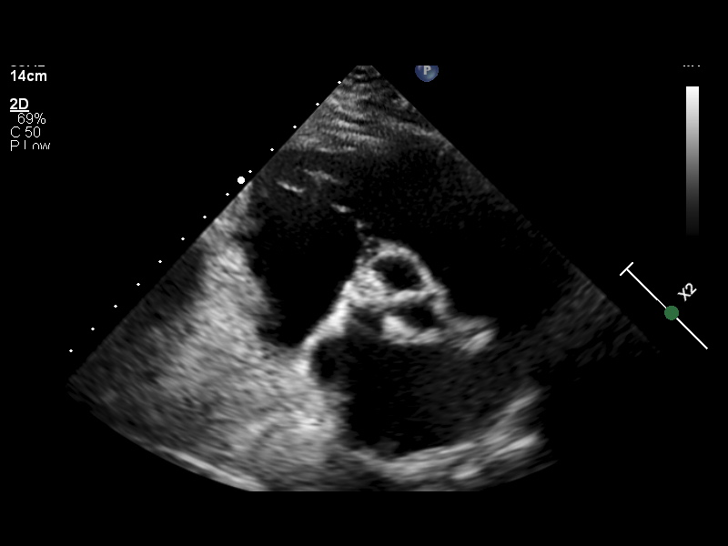
[im 34/87]
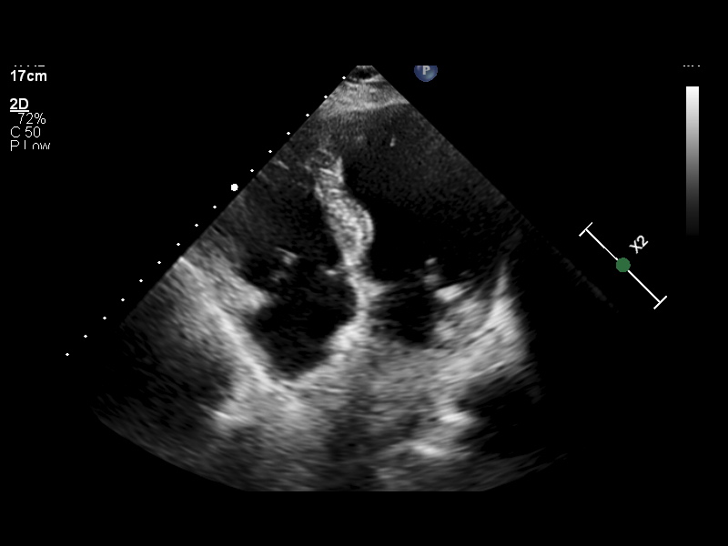
[im 42/87]
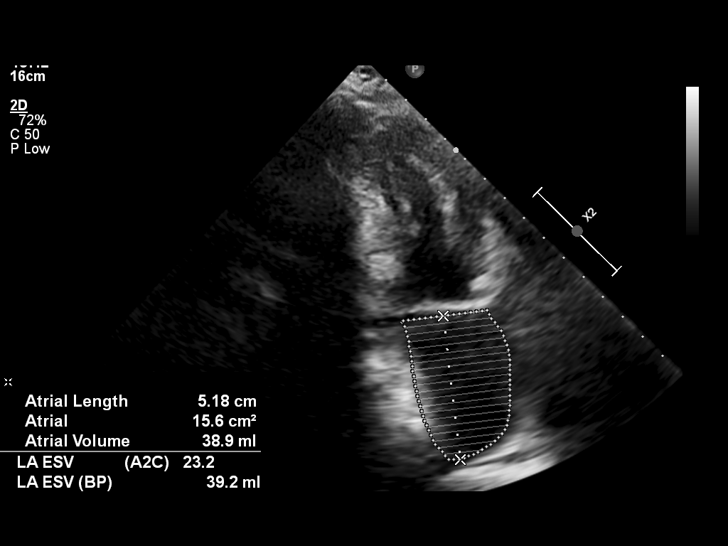
[im 53/87]
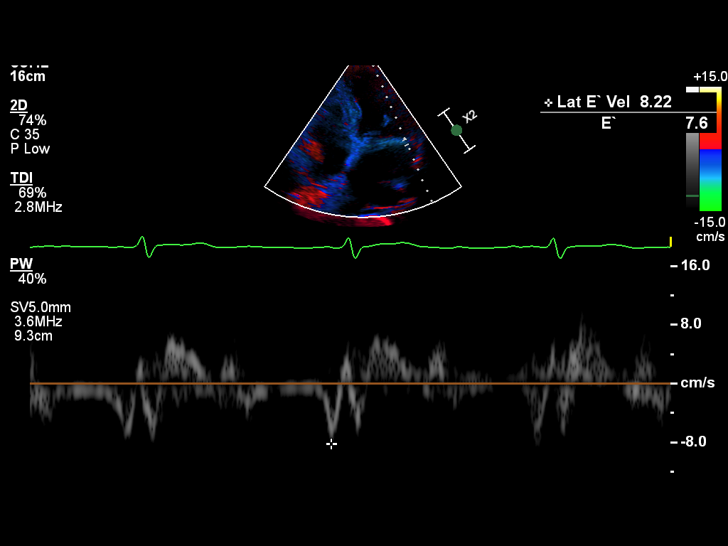
[im 60/87]
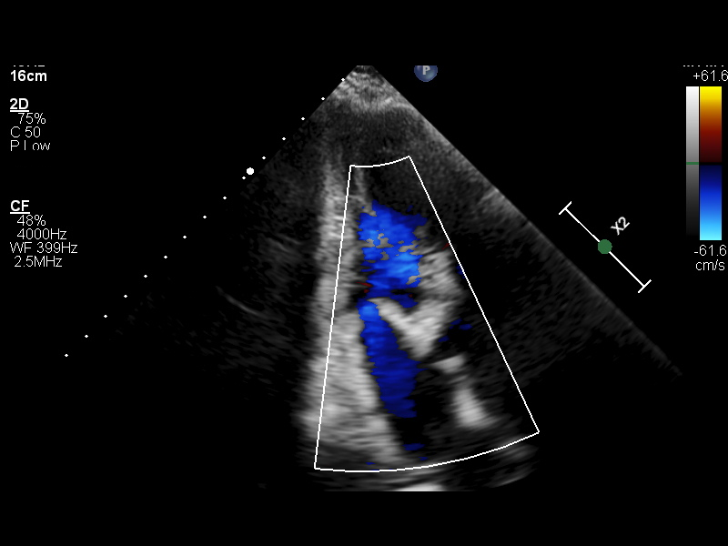
[im 68/87]
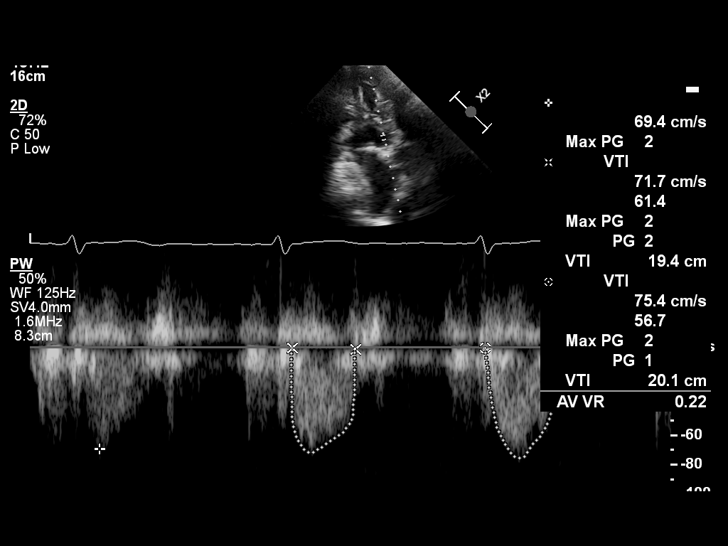
[im 79/87]
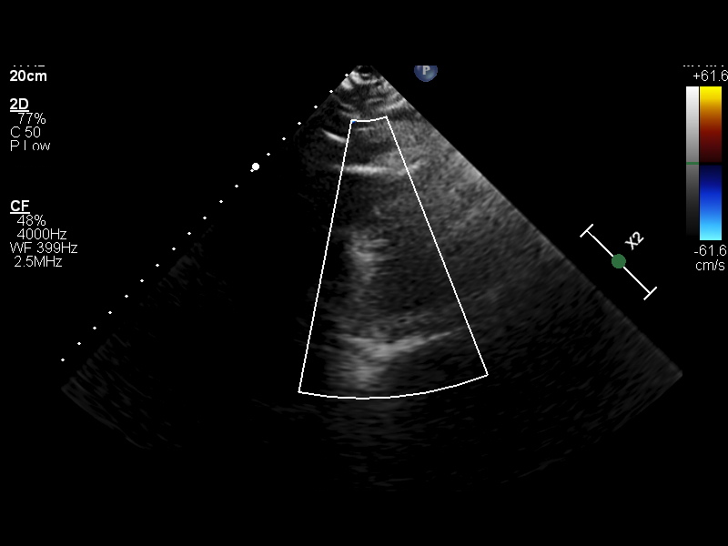
[im 87/87]
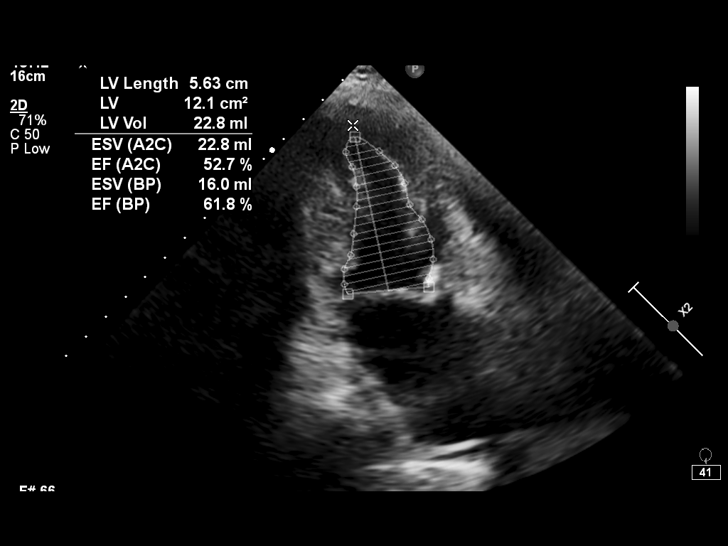

[12 of 24 positions shown; findings below may reference images not displayed]

Primary:     HUNSAKER, KHURSHID                                                        Date: [DATE]/

01/15/22 -  [DATE]D + DOPPLER ECHO
Location Performed: [HOSPITAL]

Referring Provider:
Fellow:
Location of Interp:
Sonographer: External Staff

Indications:      Aortic stenosis

Vitals
Height   Weight   BSA (Calculated)   BP   Comments
162.6 cm (5' 4")   63.5 kg (140 lb)   1.69   136/61

Interpretation Summary

The left ventricular systolic function is normal. The visually estimated ejection fraction is 60%.
There are no segmental wall motion abnormalities.
Moderate concentric hypertrophy.
The right ventricular size, wall thickness and systolic function are normal.
There is mild mitral annular calcification without stenosis.
Mild to moderate mitral valve regurgitation, moderate tricuspid valve regurgitation.
Severely sclerotic aortic valve, moderate stenosis (MG= 27 mmHg, peak velocity= 3.4 m/sec, TUV= 0.1),
no regurgitation.

Compared to the previous study dated 07/04/2021: LVEF = 60%, grade 2 diastolic dysfunction, aortic
valve MG = 19 mmHg.  Mild pulmonary hypertension, estimated PAP = 37 mmHg.

Echocardiographic Findings
Left Ventricle   The left ventricular size is normal. Wall thickness is increased. Moderate
concentric hypertrophy. The left ventricular systolic function is normal. The visually estimated
ejection fraction is 60%. There are no segmental wall motion abnormalities. Cannot determine left
ventricular diastolic function. Cannot determine left atrial pressure.
Right Ventricle   The right ventricular size, wall thickness and systolic function are normal.
Left Atrium   Normal size.
Right Atrium   Normal size.
IVC/SVC   Normal central venous pressure (0-5 mm Hg).
Mitral Valve   Non-specific thickening. Mild to moderate regurgitation. There is mild mitral annular
calcification without stenosis.
Tricuspid Valve   Normal valve structure. No stenosis. Moderate regurgitation.
Aortic Valve   Severely sclerotic aortic valve,  moderate stenosis (MG= 27   mmHg, peak velocity=
3.4   m/sec, TUV= 0.1),  no regurgitation.
Pulmonary   The pulmonic valve was not seen well but no Doppler evidence of stenosis. No
regurgitation.
Aorta   The aortic root and ascending aorta are normal in size.
Pericardium   No pericardial effusion.

Left Ventricular Wall Scoring
Resting   Score Index: 1.000   Percent Normal: 100.0%

The left ventricular wall motion is normal.

Left Heart 2D Measurements (Normal Ranges)
EF (Visual)
60 %
LVIDD
3.9 cm  (Range: 3.8 - 5.2)
LVIDS
1.8 cm  (Range: 2.2 - 3.5)
IVS
1.5 cm  (Range: 0.6 - 0.9)
LV PW
1.3 cm  (Range: 0.6 - 0.9)
LA Size
4.6 cm  (Range: 2.7 - 3.8)

Right Heart 2D   M-Mode Measurements (Normal Ranges) (Range)
RV Basal Dia
3.8 cm  (2.5 - 4.1)
RV Mid Dia
3.0 cm  (1.9 - 3.5)
ARNSTEIN
13.8 cm2  (<18)

Left Heart 2D Addnl Measurements (Normal Ranges)
LV Systolic Vol
23 mL  (Range: 14 - 42)
LV Systolic Vol Index
14 mL/m2  (Range: 8 - 24)
LV Diastolic Vol
48 mL  (Range: 46 - 106)
LV Diastolic Vol Index
28 mL/m2  (Range: 29 - 61)
LA Vol
39 mL  (Range: 22 - 52)
LA Vol Index
23.08 mL/m2  (Range: 16 - 34)
LV Mass
201 g  (Range: 67 - 162)
LV Mass Index
119 g/m2  (Range: 43 - 95)
RWT
0.67  (Range: <=0.42)

Aortic Root Measurements (Normal Ranges)
Sinus
3.2 cm  (Range: 2.4 - 3.6)
COLEMAN STRYKER
2.6 cm

Doppler (Spectral and Color Flow)
Estimated Peak Systolic PA Pressure
Aortic valve mean gradient
Aortic valve peak gradient
Aortic valve peak velocity
3.4 m/s
Aortic valve velocity ratio

Tech Notes:

## 2022-12-03 ENCOUNTER — Encounter: Admit: 2022-12-03 | Discharge: 2022-12-03 | Payer: MEDICARE

## 2022-12-06 ENCOUNTER — Encounter: Admit: 2022-12-06 | Discharge: 2022-12-06 | Payer: MEDICARE

## 2022-12-06 DIAGNOSIS — IMO0002 Squamous cell carcinoma: Secondary | ICD-10-CM

## 2022-12-06 DIAGNOSIS — K219 Gastro-esophageal reflux disease without esophagitis: Secondary | ICD-10-CM

## 2022-12-06 DIAGNOSIS — I10 Essential (primary) hypertension: Secondary | ICD-10-CM

## 2022-12-06 DIAGNOSIS — I350 Nonrheumatic aortic (valve) stenosis: Secondary | ICD-10-CM

## 2022-12-06 DIAGNOSIS — M8580 Other specified disorders of bone density and structure, unspecified site: Secondary | ICD-10-CM

## 2022-12-06 DIAGNOSIS — I272 Pulmonary hypertension, unspecified: Secondary | ICD-10-CM

## 2022-12-19 ENCOUNTER — Encounter: Admit: 2022-12-19 | Discharge: 2022-12-19 | Payer: MEDICARE

## 2023-01-03 ENCOUNTER — Encounter: Admit: 2023-01-03 | Discharge: 2023-01-03 | Payer: MEDICARE

## 2023-06-12 ENCOUNTER — Encounter: Admit: 2023-06-12 | Discharge: 2023-06-12 | Payer: MEDICARE

## 2023-07-25 ENCOUNTER — Encounter: Admit: 2023-07-25 | Discharge: 2023-07-25 | Payer: MEDICARE

## 2023-07-25 ENCOUNTER — Ambulatory Visit: Admit: 2023-07-25 | Discharge: 2023-07-25 | Payer: MEDICARE

## 2024-05-19 ENCOUNTER — Encounter: Admit: 2024-05-19 | Discharge: 2024-05-19 | Payer: BLUE CROSS/BLUE SHIELD

## 2024-08-04 ENCOUNTER — Encounter: Admit: 2024-08-04 | Discharge: 2024-08-04 | Payer: BLUE CROSS/BLUE SHIELD

## 2024-09-03 ENCOUNTER — Encounter: Admit: 2024-09-03 | Discharge: 2024-09-03 | Payer: BLUE CROSS/BLUE SHIELD
